# Patient Record
Sex: Male | Born: 1955 | Race: White | Hispanic: No | Smoking: Never smoker
Health system: Southern US, Community
[De-identification: ages and names within clinical notes are randomized; demographics above are authoritative.]

## PROBLEM LIST (undated history)

## (undated) DIAGNOSIS — E119 Type 2 diabetes mellitus without complications: Secondary | ICD-10-CM

## (undated) DIAGNOSIS — I10 Essential (primary) hypertension: Secondary | ICD-10-CM

## (undated) DIAGNOSIS — E785 Hyperlipidemia, unspecified: Secondary | ICD-10-CM

## (undated) HISTORY — DX: Type 2 diabetes mellitus without complications: E11.9

## (undated) HISTORY — PX: SHOULDER SURGERY: SHX246

## (undated) HISTORY — DX: Essential (primary) hypertension: I10

## (undated) HISTORY — PX: KNEE SURGERY: SHX244

## (undated) HISTORY — DX: Hyperlipidemia, unspecified: E78.5

## (undated) HISTORY — PX: CHOLECYSTECTOMY: SHX55

---

## 2013-01-21 ENCOUNTER — Encounter (INDEPENDENT_AMBULATORY_CARE_PROVIDER_SITE_OTHER): Payer: Self-pay | Admitting: *Deleted

## 2015-09-01 DIAGNOSIS — R319 Hematuria, unspecified: Secondary | ICD-10-CM | POA: Diagnosis not present

## 2015-09-01 DIAGNOSIS — R109 Unspecified abdominal pain: Secondary | ICD-10-CM | POA: Diagnosis not present

## 2015-09-01 DIAGNOSIS — N200 Calculus of kidney: Secondary | ICD-10-CM | POA: Diagnosis not present

## 2015-09-02 DIAGNOSIS — R361 Hematospermia: Secondary | ICD-10-CM | POA: Diagnosis not present

## 2015-09-02 DIAGNOSIS — R319 Hematuria, unspecified: Secondary | ICD-10-CM | POA: Diagnosis not present

## 2015-09-07 DIAGNOSIS — R590 Localized enlarged lymph nodes: Secondary | ICD-10-CM | POA: Diagnosis not present

## 2015-09-07 DIAGNOSIS — R361 Hematospermia: Secondary | ICD-10-CM | POA: Diagnosis not present

## 2015-09-07 DIAGNOSIS — R319 Hematuria, unspecified: Secondary | ICD-10-CM | POA: Diagnosis not present

## 2015-09-07 DIAGNOSIS — K760 Fatty (change of) liver, not elsewhere classified: Secondary | ICD-10-CM | POA: Diagnosis not present

## 2015-09-07 DIAGNOSIS — M5136 Other intervertebral disc degeneration, lumbar region: Secondary | ICD-10-CM | POA: Diagnosis not present

## 2015-09-07 DIAGNOSIS — M47816 Spondylosis without myelopathy or radiculopathy, lumbar region: Secondary | ICD-10-CM | POA: Diagnosis not present

## 2015-09-07 DIAGNOSIS — N289 Disorder of kidney and ureter, unspecified: Secondary | ICD-10-CM | POA: Diagnosis not present

## 2015-09-29 DIAGNOSIS — R361 Hematospermia: Secondary | ICD-10-CM | POA: Diagnosis not present

## 2015-09-29 DIAGNOSIS — R319 Hematuria, unspecified: Secondary | ICD-10-CM | POA: Diagnosis not present

## 2015-10-07 DIAGNOSIS — N400 Enlarged prostate without lower urinary tract symptoms: Secondary | ICD-10-CM | POA: Diagnosis not present

## 2015-10-07 DIAGNOSIS — Z789 Other specified health status: Secondary | ICD-10-CM | POA: Diagnosis not present

## 2015-10-07 DIAGNOSIS — Z299 Encounter for prophylactic measures, unspecified: Secondary | ICD-10-CM | POA: Diagnosis not present

## 2015-10-07 DIAGNOSIS — E1165 Type 2 diabetes mellitus with hyperglycemia: Secondary | ICD-10-CM | POA: Diagnosis not present

## 2015-12-17 DIAGNOSIS — M159 Polyosteoarthritis, unspecified: Secondary | ICD-10-CM | POA: Diagnosis not present

## 2015-12-17 DIAGNOSIS — E119 Type 2 diabetes mellitus without complications: Secondary | ICD-10-CM | POA: Diagnosis not present

## 2016-01-14 DIAGNOSIS — N400 Enlarged prostate without lower urinary tract symptoms: Secondary | ICD-10-CM | POA: Diagnosis not present

## 2016-01-14 DIAGNOSIS — M545 Low back pain: Secondary | ICD-10-CM | POA: Diagnosis not present

## 2016-01-14 DIAGNOSIS — E1165 Type 2 diabetes mellitus with hyperglycemia: Secondary | ICD-10-CM | POA: Diagnosis not present

## 2016-01-14 DIAGNOSIS — Z6836 Body mass index (BMI) 36.0-36.9, adult: Secondary | ICD-10-CM | POA: Diagnosis not present

## 2016-02-08 DIAGNOSIS — N200 Calculus of kidney: Secondary | ICD-10-CM | POA: Diagnosis not present

## 2016-03-18 DIAGNOSIS — M159 Polyosteoarthritis, unspecified: Secondary | ICD-10-CM | POA: Diagnosis not present

## 2016-03-18 DIAGNOSIS — E119 Type 2 diabetes mellitus without complications: Secondary | ICD-10-CM | POA: Diagnosis not present

## 2016-03-28 DIAGNOSIS — R8299 Other abnormal findings in urine: Secondary | ICD-10-CM | POA: Diagnosis not present

## 2016-03-28 DIAGNOSIS — R3915 Urgency of urination: Secondary | ICD-10-CM | POA: Diagnosis not present

## 2016-03-28 DIAGNOSIS — N200 Calculus of kidney: Secondary | ICD-10-CM | POA: Diagnosis not present

## 2016-04-21 DIAGNOSIS — M545 Low back pain: Secondary | ICD-10-CM | POA: Diagnosis not present

## 2016-04-21 DIAGNOSIS — Z6836 Body mass index (BMI) 36.0-36.9, adult: Secondary | ICD-10-CM | POA: Diagnosis not present

## 2016-04-21 DIAGNOSIS — E1165 Type 2 diabetes mellitus with hyperglycemia: Secondary | ICD-10-CM | POA: Diagnosis not present

## 2016-04-21 DIAGNOSIS — Z713 Dietary counseling and surveillance: Secondary | ICD-10-CM | POA: Diagnosis not present

## 2016-05-03 DIAGNOSIS — M159 Polyosteoarthritis, unspecified: Secondary | ICD-10-CM | POA: Diagnosis not present

## 2016-05-03 DIAGNOSIS — E119 Type 2 diabetes mellitus without complications: Secondary | ICD-10-CM | POA: Diagnosis not present

## 2016-05-04 DIAGNOSIS — Z1389 Encounter for screening for other disorder: Secondary | ICD-10-CM | POA: Diagnosis not present

## 2016-05-04 DIAGNOSIS — Z0000 Encounter for general adult medical examination without abnormal findings: Secondary | ICD-10-CM | POA: Diagnosis not present

## 2016-05-04 DIAGNOSIS — Z7189 Other specified counseling: Secondary | ICD-10-CM | POA: Diagnosis not present

## 2016-05-04 DIAGNOSIS — Z299 Encounter for prophylactic measures, unspecified: Secondary | ICD-10-CM | POA: Diagnosis not present

## 2016-05-04 DIAGNOSIS — Z1211 Encounter for screening for malignant neoplasm of colon: Secondary | ICD-10-CM | POA: Diagnosis not present

## 2016-05-05 DIAGNOSIS — Z125 Encounter for screening for malignant neoplasm of prostate: Secondary | ICD-10-CM | POA: Diagnosis not present

## 2016-05-05 DIAGNOSIS — Z79899 Other long term (current) drug therapy: Secondary | ICD-10-CM | POA: Diagnosis not present

## 2016-05-05 DIAGNOSIS — R5383 Other fatigue: Secondary | ICD-10-CM | POA: Diagnosis not present

## 2016-05-05 DIAGNOSIS — E7800 Pure hypercholesterolemia, unspecified: Secondary | ICD-10-CM | POA: Diagnosis not present

## 2016-08-12 DIAGNOSIS — E119 Type 2 diabetes mellitus without complications: Secondary | ICD-10-CM | POA: Diagnosis not present

## 2016-08-12 DIAGNOSIS — M159 Polyosteoarthritis, unspecified: Secondary | ICD-10-CM | POA: Diagnosis not present

## 2016-08-23 DIAGNOSIS — E119 Type 2 diabetes mellitus without complications: Secondary | ICD-10-CM | POA: Diagnosis not present

## 2016-09-06 DIAGNOSIS — M159 Polyosteoarthritis, unspecified: Secondary | ICD-10-CM | POA: Diagnosis not present

## 2016-09-06 DIAGNOSIS — E119 Type 2 diabetes mellitus without complications: Secondary | ICD-10-CM | POA: Diagnosis not present

## 2016-10-06 DIAGNOSIS — H35033 Hypertensive retinopathy, bilateral: Secondary | ICD-10-CM | POA: Diagnosis not present

## 2016-10-17 DIAGNOSIS — M159 Polyosteoarthritis, unspecified: Secondary | ICD-10-CM | POA: Diagnosis not present

## 2016-10-17 DIAGNOSIS — E119 Type 2 diabetes mellitus without complications: Secondary | ICD-10-CM | POA: Diagnosis not present

## 2016-11-28 DIAGNOSIS — E119 Type 2 diabetes mellitus without complications: Secondary | ICD-10-CM | POA: Diagnosis not present

## 2016-11-28 DIAGNOSIS — M159 Polyosteoarthritis, unspecified: Secondary | ICD-10-CM | POA: Diagnosis not present

## 2016-11-29 DIAGNOSIS — E1165 Type 2 diabetes mellitus with hyperglycemia: Secondary | ICD-10-CM | POA: Diagnosis not present

## 2016-11-29 DIAGNOSIS — Z299 Encounter for prophylactic measures, unspecified: Secondary | ICD-10-CM | POA: Diagnosis not present

## 2016-11-29 DIAGNOSIS — Z6835 Body mass index (BMI) 35.0-35.9, adult: Secondary | ICD-10-CM | POA: Diagnosis not present

## 2016-11-29 DIAGNOSIS — N400 Enlarged prostate without lower urinary tract symptoms: Secondary | ICD-10-CM | POA: Diagnosis not present

## 2016-11-29 DIAGNOSIS — M1710 Unilateral primary osteoarthritis, unspecified knee: Secondary | ICD-10-CM | POA: Diagnosis not present

## 2016-11-29 DIAGNOSIS — E7800 Pure hypercholesterolemia, unspecified: Secondary | ICD-10-CM | POA: Diagnosis not present

## 2016-12-21 DIAGNOSIS — E119 Type 2 diabetes mellitus without complications: Secondary | ICD-10-CM | POA: Diagnosis not present

## 2016-12-21 DIAGNOSIS — M159 Polyosteoarthritis, unspecified: Secondary | ICD-10-CM | POA: Diagnosis not present

## 2017-03-03 DIAGNOSIS — E119 Type 2 diabetes mellitus without complications: Secondary | ICD-10-CM | POA: Diagnosis not present

## 2017-03-03 DIAGNOSIS — M159 Polyosteoarthritis, unspecified: Secondary | ICD-10-CM | POA: Diagnosis not present

## 2017-03-06 DIAGNOSIS — Z299 Encounter for prophylactic measures, unspecified: Secondary | ICD-10-CM | POA: Diagnosis not present

## 2017-03-06 DIAGNOSIS — Z713 Dietary counseling and surveillance: Secondary | ICD-10-CM | POA: Diagnosis not present

## 2017-03-06 DIAGNOSIS — E1165 Type 2 diabetes mellitus with hyperglycemia: Secondary | ICD-10-CM | POA: Diagnosis not present

## 2017-03-06 DIAGNOSIS — M1710 Unilateral primary osteoarthritis, unspecified knee: Secondary | ICD-10-CM | POA: Diagnosis not present

## 2017-03-06 DIAGNOSIS — N209 Urinary calculus, unspecified: Secondary | ICD-10-CM | POA: Diagnosis not present

## 2017-03-06 DIAGNOSIS — E7800 Pure hypercholesterolemia, unspecified: Secondary | ICD-10-CM | POA: Diagnosis not present

## 2017-03-06 DIAGNOSIS — N400 Enlarged prostate without lower urinary tract symptoms: Secondary | ICD-10-CM | POA: Diagnosis not present

## 2017-03-06 DIAGNOSIS — Z6835 Body mass index (BMI) 35.0-35.9, adult: Secondary | ICD-10-CM | POA: Diagnosis not present

## 2017-05-10 DIAGNOSIS — Z789 Other specified health status: Secondary | ICD-10-CM | POA: Diagnosis not present

## 2017-05-10 DIAGNOSIS — Z299 Encounter for prophylactic measures, unspecified: Secondary | ICD-10-CM | POA: Diagnosis not present

## 2017-05-10 DIAGNOSIS — Z1331 Encounter for screening for depression: Secondary | ICD-10-CM | POA: Diagnosis not present

## 2017-05-10 DIAGNOSIS — Z6835 Body mass index (BMI) 35.0-35.9, adult: Secondary | ICD-10-CM | POA: Diagnosis not present

## 2017-05-10 DIAGNOSIS — Z1339 Encounter for screening examination for other mental health and behavioral disorders: Secondary | ICD-10-CM | POA: Diagnosis not present

## 2017-05-10 DIAGNOSIS — Z0000 Encounter for general adult medical examination without abnormal findings: Secondary | ICD-10-CM | POA: Diagnosis not present

## 2017-05-10 DIAGNOSIS — Z7189 Other specified counseling: Secondary | ICD-10-CM | POA: Diagnosis not present

## 2017-05-10 DIAGNOSIS — Z1211 Encounter for screening for malignant neoplasm of colon: Secondary | ICD-10-CM | POA: Diagnosis not present

## 2017-05-11 DIAGNOSIS — Z79899 Other long term (current) drug therapy: Secondary | ICD-10-CM | POA: Diagnosis not present

## 2017-05-11 DIAGNOSIS — Z125 Encounter for screening for malignant neoplasm of prostate: Secondary | ICD-10-CM | POA: Diagnosis not present

## 2017-05-11 DIAGNOSIS — E7800 Pure hypercholesterolemia, unspecified: Secondary | ICD-10-CM | POA: Diagnosis not present

## 2017-05-11 DIAGNOSIS — R5383 Other fatigue: Secondary | ICD-10-CM | POA: Diagnosis not present

## 2017-05-19 DIAGNOSIS — M159 Polyosteoarthritis, unspecified: Secondary | ICD-10-CM | POA: Diagnosis not present

## 2017-05-19 DIAGNOSIS — E119 Type 2 diabetes mellitus without complications: Secondary | ICD-10-CM | POA: Diagnosis not present

## 2017-06-26 DIAGNOSIS — E1165 Type 2 diabetes mellitus with hyperglycemia: Secondary | ICD-10-CM | POA: Diagnosis not present

## 2017-06-26 DIAGNOSIS — N400 Enlarged prostate without lower urinary tract symptoms: Secondary | ICD-10-CM | POA: Diagnosis not present

## 2017-06-26 DIAGNOSIS — Z6835 Body mass index (BMI) 35.0-35.9, adult: Secondary | ICD-10-CM | POA: Diagnosis not present

## 2017-06-26 DIAGNOSIS — Z299 Encounter for prophylactic measures, unspecified: Secondary | ICD-10-CM | POA: Diagnosis not present

## 2017-06-26 DIAGNOSIS — Z713 Dietary counseling and surveillance: Secondary | ICD-10-CM | POA: Diagnosis not present

## 2017-07-03 DIAGNOSIS — M159 Polyosteoarthritis, unspecified: Secondary | ICD-10-CM | POA: Diagnosis not present

## 2017-07-03 DIAGNOSIS — E119 Type 2 diabetes mellitus without complications: Secondary | ICD-10-CM | POA: Diagnosis not present

## 2017-10-17 DIAGNOSIS — K5900 Constipation, unspecified: Secondary | ICD-10-CM | POA: Diagnosis not present

## 2017-10-17 DIAGNOSIS — Z6836 Body mass index (BMI) 36.0-36.9, adult: Secondary | ICD-10-CM | POA: Diagnosis not present

## 2017-10-17 DIAGNOSIS — Z299 Encounter for prophylactic measures, unspecified: Secondary | ICD-10-CM | POA: Diagnosis not present

## 2017-10-17 DIAGNOSIS — E1165 Type 2 diabetes mellitus with hyperglycemia: Secondary | ICD-10-CM | POA: Diagnosis not present

## 2017-10-17 DIAGNOSIS — E7800 Pure hypercholesterolemia, unspecified: Secondary | ICD-10-CM | POA: Diagnosis not present

## 2017-10-24 DIAGNOSIS — E119 Type 2 diabetes mellitus without complications: Secondary | ICD-10-CM | POA: Diagnosis not present

## 2017-10-24 DIAGNOSIS — M159 Polyosteoarthritis, unspecified: Secondary | ICD-10-CM | POA: Diagnosis not present

## 2017-12-28 DIAGNOSIS — E119 Type 2 diabetes mellitus without complications: Secondary | ICD-10-CM | POA: Diagnosis not present

## 2017-12-28 DIAGNOSIS — M159 Polyosteoarthritis, unspecified: Secondary | ICD-10-CM | POA: Diagnosis not present

## 2018-01-22 DIAGNOSIS — N400 Enlarged prostate without lower urinary tract symptoms: Secondary | ICD-10-CM | POA: Diagnosis not present

## 2018-01-22 DIAGNOSIS — E1165 Type 2 diabetes mellitus with hyperglycemia: Secondary | ICD-10-CM | POA: Diagnosis not present

## 2018-01-22 DIAGNOSIS — Z6836 Body mass index (BMI) 36.0-36.9, adult: Secondary | ICD-10-CM | POA: Diagnosis not present

## 2018-01-22 DIAGNOSIS — Z299 Encounter for prophylactic measures, unspecified: Secondary | ICD-10-CM | POA: Diagnosis not present

## 2018-01-22 DIAGNOSIS — Z713 Dietary counseling and surveillance: Secondary | ICD-10-CM | POA: Diagnosis not present

## 2018-01-26 DIAGNOSIS — E119 Type 2 diabetes mellitus without complications: Secondary | ICD-10-CM | POA: Diagnosis not present

## 2018-01-26 DIAGNOSIS — M159 Polyosteoarthritis, unspecified: Secondary | ICD-10-CM | POA: Diagnosis not present

## 2018-02-28 DIAGNOSIS — M159 Polyosteoarthritis, unspecified: Secondary | ICD-10-CM | POA: Diagnosis not present

## 2018-02-28 DIAGNOSIS — E119 Type 2 diabetes mellitus without complications: Secondary | ICD-10-CM | POA: Diagnosis not present

## 2018-04-13 DIAGNOSIS — E119 Type 2 diabetes mellitus without complications: Secondary | ICD-10-CM | POA: Diagnosis not present

## 2018-04-13 DIAGNOSIS — M159 Polyosteoarthritis, unspecified: Secondary | ICD-10-CM | POA: Diagnosis not present

## 2018-04-30 DIAGNOSIS — E1165 Type 2 diabetes mellitus with hyperglycemia: Secondary | ICD-10-CM | POA: Diagnosis not present

## 2018-04-30 DIAGNOSIS — Z6835 Body mass index (BMI) 35.0-35.9, adult: Secondary | ICD-10-CM | POA: Diagnosis not present

## 2018-04-30 DIAGNOSIS — Z299 Encounter for prophylactic measures, unspecified: Secondary | ICD-10-CM | POA: Diagnosis not present

## 2018-04-30 DIAGNOSIS — E7800 Pure hypercholesterolemia, unspecified: Secondary | ICD-10-CM | POA: Diagnosis not present

## 2018-04-30 DIAGNOSIS — I10 Essential (primary) hypertension: Secondary | ICD-10-CM | POA: Diagnosis not present

## 2018-04-30 DIAGNOSIS — Z87891 Personal history of nicotine dependence: Secondary | ICD-10-CM | POA: Diagnosis not present

## 2018-04-30 DIAGNOSIS — N400 Enlarged prostate without lower urinary tract symptoms: Secondary | ICD-10-CM | POA: Diagnosis not present

## 2018-05-15 DIAGNOSIS — Z299 Encounter for prophylactic measures, unspecified: Secondary | ICD-10-CM | POA: Diagnosis not present

## 2018-05-15 DIAGNOSIS — Z1339 Encounter for screening examination for other mental health and behavioral disorders: Secondary | ICD-10-CM | POA: Diagnosis not present

## 2018-05-15 DIAGNOSIS — Z1331 Encounter for screening for depression: Secondary | ICD-10-CM | POA: Diagnosis not present

## 2018-05-15 DIAGNOSIS — Z0000 Encounter for general adult medical examination without abnormal findings: Secondary | ICD-10-CM | POA: Diagnosis not present

## 2018-05-15 DIAGNOSIS — E7800 Pure hypercholesterolemia, unspecified: Secondary | ICD-10-CM | POA: Diagnosis not present

## 2018-05-15 DIAGNOSIS — Z1211 Encounter for screening for malignant neoplasm of colon: Secondary | ICD-10-CM | POA: Diagnosis not present

## 2018-05-15 DIAGNOSIS — I10 Essential (primary) hypertension: Secondary | ICD-10-CM | POA: Diagnosis not present

## 2018-05-15 DIAGNOSIS — Z6835 Body mass index (BMI) 35.0-35.9, adult: Secondary | ICD-10-CM | POA: Diagnosis not present

## 2018-05-15 DIAGNOSIS — R5383 Other fatigue: Secondary | ICD-10-CM | POA: Diagnosis not present

## 2018-05-15 DIAGNOSIS — Z7189 Other specified counseling: Secondary | ICD-10-CM | POA: Diagnosis not present

## 2018-05-17 DIAGNOSIS — Z79899 Other long term (current) drug therapy: Secondary | ICD-10-CM | POA: Diagnosis not present

## 2018-05-17 DIAGNOSIS — R5383 Other fatigue: Secondary | ICD-10-CM | POA: Diagnosis not present

## 2018-05-17 DIAGNOSIS — Z125 Encounter for screening for malignant neoplasm of prostate: Secondary | ICD-10-CM | POA: Diagnosis not present

## 2018-05-17 DIAGNOSIS — E7800 Pure hypercholesterolemia, unspecified: Secondary | ICD-10-CM | POA: Diagnosis not present

## 2018-06-25 DIAGNOSIS — R195 Other fecal abnormalities: Secondary | ICD-10-CM | POA: Diagnosis not present

## 2018-06-27 DIAGNOSIS — M159 Polyosteoarthritis, unspecified: Secondary | ICD-10-CM | POA: Diagnosis not present

## 2018-06-27 DIAGNOSIS — E119 Type 2 diabetes mellitus without complications: Secondary | ICD-10-CM | POA: Diagnosis not present

## 2018-07-02 DIAGNOSIS — R945 Abnormal results of liver function studies: Secondary | ICD-10-CM | POA: Diagnosis not present

## 2018-07-20 DIAGNOSIS — M159 Polyosteoarthritis, unspecified: Secondary | ICD-10-CM | POA: Diagnosis not present

## 2018-07-20 DIAGNOSIS — E119 Type 2 diabetes mellitus without complications: Secondary | ICD-10-CM | POA: Diagnosis not present

## 2018-08-23 DIAGNOSIS — R945 Abnormal results of liver function studies: Secondary | ICD-10-CM | POA: Diagnosis not present

## 2018-08-30 DIAGNOSIS — E1165 Type 2 diabetes mellitus with hyperglycemia: Secondary | ICD-10-CM | POA: Diagnosis not present

## 2018-08-30 DIAGNOSIS — Z299 Encounter for prophylactic measures, unspecified: Secondary | ICD-10-CM | POA: Diagnosis not present

## 2018-08-30 DIAGNOSIS — R748 Abnormal levels of other serum enzymes: Secondary | ICD-10-CM | POA: Diagnosis not present

## 2018-08-30 DIAGNOSIS — Z6836 Body mass index (BMI) 36.0-36.9, adult: Secondary | ICD-10-CM | POA: Diagnosis not present

## 2018-08-30 DIAGNOSIS — I10 Essential (primary) hypertension: Secondary | ICD-10-CM | POA: Diagnosis not present

## 2018-09-03 DIAGNOSIS — M159 Polyosteoarthritis, unspecified: Secondary | ICD-10-CM | POA: Diagnosis not present

## 2018-09-03 DIAGNOSIS — E119 Type 2 diabetes mellitus without complications: Secondary | ICD-10-CM | POA: Diagnosis not present

## 2018-09-17 DIAGNOSIS — K760 Fatty (change of) liver, not elsewhere classified: Secondary | ICD-10-CM | POA: Diagnosis not present

## 2018-09-17 DIAGNOSIS — R945 Abnormal results of liver function studies: Secondary | ICD-10-CM | POA: Diagnosis not present

## 2018-10-04 DIAGNOSIS — E119 Type 2 diabetes mellitus without complications: Secondary | ICD-10-CM | POA: Diagnosis not present

## 2018-10-04 DIAGNOSIS — M159 Polyosteoarthritis, unspecified: Secondary | ICD-10-CM | POA: Diagnosis not present

## 2018-11-29 DIAGNOSIS — M159 Polyosteoarthritis, unspecified: Secondary | ICD-10-CM | POA: Diagnosis not present

## 2018-11-29 DIAGNOSIS — E119 Type 2 diabetes mellitus without complications: Secondary | ICD-10-CM | POA: Diagnosis not present

## 2018-12-05 DIAGNOSIS — E1165 Type 2 diabetes mellitus with hyperglycemia: Secondary | ICD-10-CM | POA: Diagnosis not present

## 2018-12-05 DIAGNOSIS — Z713 Dietary counseling and surveillance: Secondary | ICD-10-CM | POA: Diagnosis not present

## 2018-12-05 DIAGNOSIS — Z299 Encounter for prophylactic measures, unspecified: Secondary | ICD-10-CM | POA: Diagnosis not present

## 2018-12-05 DIAGNOSIS — I10 Essential (primary) hypertension: Secondary | ICD-10-CM | POA: Diagnosis not present

## 2018-12-05 DIAGNOSIS — Z6835 Body mass index (BMI) 35.0-35.9, adult: Secondary | ICD-10-CM | POA: Diagnosis not present

## 2018-12-26 DIAGNOSIS — E119 Type 2 diabetes mellitus without complications: Secondary | ICD-10-CM | POA: Diagnosis not present

## 2018-12-26 DIAGNOSIS — M159 Polyosteoarthritis, unspecified: Secondary | ICD-10-CM | POA: Diagnosis not present

## 2019-01-22 DIAGNOSIS — M159 Polyosteoarthritis, unspecified: Secondary | ICD-10-CM | POA: Diagnosis not present

## 2019-01-22 DIAGNOSIS — E119 Type 2 diabetes mellitus without complications: Secondary | ICD-10-CM | POA: Diagnosis not present

## 2019-02-20 DIAGNOSIS — M159 Polyosteoarthritis, unspecified: Secondary | ICD-10-CM | POA: Diagnosis not present

## 2019-02-20 DIAGNOSIS — E119 Type 2 diabetes mellitus without complications: Secondary | ICD-10-CM | POA: Diagnosis not present

## 2019-03-13 DIAGNOSIS — Z299 Encounter for prophylactic measures, unspecified: Secondary | ICD-10-CM | POA: Diagnosis not present

## 2019-03-13 DIAGNOSIS — I10 Essential (primary) hypertension: Secondary | ICD-10-CM | POA: Diagnosis not present

## 2019-03-13 DIAGNOSIS — Z6835 Body mass index (BMI) 35.0-35.9, adult: Secondary | ICD-10-CM | POA: Diagnosis not present

## 2019-03-13 DIAGNOSIS — Z713 Dietary counseling and surveillance: Secondary | ICD-10-CM | POA: Diagnosis not present

## 2019-03-13 DIAGNOSIS — E1165 Type 2 diabetes mellitus with hyperglycemia: Secondary | ICD-10-CM | POA: Diagnosis not present

## 2019-03-21 DIAGNOSIS — E119 Type 2 diabetes mellitus without complications: Secondary | ICD-10-CM | POA: Diagnosis not present

## 2019-03-21 DIAGNOSIS — M159 Polyosteoarthritis, unspecified: Secondary | ICD-10-CM | POA: Diagnosis not present

## 2019-04-22 DIAGNOSIS — M159 Polyosteoarthritis, unspecified: Secondary | ICD-10-CM | POA: Diagnosis not present

## 2019-04-22 DIAGNOSIS — E119 Type 2 diabetes mellitus without complications: Secondary | ICD-10-CM | POA: Diagnosis not present

## 2019-05-21 DIAGNOSIS — Z299 Encounter for prophylactic measures, unspecified: Secondary | ICD-10-CM | POA: Diagnosis not present

## 2019-05-21 DIAGNOSIS — Z1211 Encounter for screening for malignant neoplasm of colon: Secondary | ICD-10-CM | POA: Diagnosis not present

## 2019-05-21 DIAGNOSIS — E7800 Pure hypercholesterolemia, unspecified: Secondary | ICD-10-CM | POA: Diagnosis not present

## 2019-05-21 DIAGNOSIS — Z1339 Encounter for screening examination for other mental health and behavioral disorders: Secondary | ICD-10-CM | POA: Diagnosis not present

## 2019-05-21 DIAGNOSIS — Z6835 Body mass index (BMI) 35.0-35.9, adult: Secondary | ICD-10-CM | POA: Diagnosis not present

## 2019-05-21 DIAGNOSIS — Z1331 Encounter for screening for depression: Secondary | ICD-10-CM | POA: Diagnosis not present

## 2019-05-21 DIAGNOSIS — Z7189 Other specified counseling: Secondary | ICD-10-CM | POA: Diagnosis not present

## 2019-05-21 DIAGNOSIS — Z0000 Encounter for general adult medical examination without abnormal findings: Secondary | ICD-10-CM | POA: Diagnosis not present

## 2019-05-21 DIAGNOSIS — R5383 Other fatigue: Secondary | ICD-10-CM | POA: Diagnosis not present

## 2019-05-21 DIAGNOSIS — I10 Essential (primary) hypertension: Secondary | ICD-10-CM | POA: Diagnosis not present

## 2019-05-21 DIAGNOSIS — E1165 Type 2 diabetes mellitus with hyperglycemia: Secondary | ICD-10-CM | POA: Diagnosis not present

## 2019-05-22 DIAGNOSIS — Z79899 Other long term (current) drug therapy: Secondary | ICD-10-CM | POA: Diagnosis not present

## 2019-05-22 DIAGNOSIS — R5383 Other fatigue: Secondary | ICD-10-CM | POA: Diagnosis not present

## 2019-05-22 DIAGNOSIS — Z125 Encounter for screening for malignant neoplasm of prostate: Secondary | ICD-10-CM | POA: Diagnosis not present

## 2019-05-22 DIAGNOSIS — E7800 Pure hypercholesterolemia, unspecified: Secondary | ICD-10-CM | POA: Diagnosis not present

## 2019-05-31 DIAGNOSIS — E119 Type 2 diabetes mellitus without complications: Secondary | ICD-10-CM | POA: Diagnosis not present

## 2019-05-31 DIAGNOSIS — M159 Polyosteoarthritis, unspecified: Secondary | ICD-10-CM | POA: Diagnosis not present

## 2019-06-18 DIAGNOSIS — E1165 Type 2 diabetes mellitus with hyperglycemia: Secondary | ICD-10-CM | POA: Diagnosis not present

## 2019-06-18 DIAGNOSIS — Z713 Dietary counseling and surveillance: Secondary | ICD-10-CM | POA: Diagnosis not present

## 2019-06-18 DIAGNOSIS — Z299 Encounter for prophylactic measures, unspecified: Secondary | ICD-10-CM | POA: Diagnosis not present

## 2019-06-18 DIAGNOSIS — I10 Essential (primary) hypertension: Secondary | ICD-10-CM | POA: Diagnosis not present

## 2019-06-18 DIAGNOSIS — Z6835 Body mass index (BMI) 35.0-35.9, adult: Secondary | ICD-10-CM | POA: Diagnosis not present

## 2019-07-16 DIAGNOSIS — E1165 Type 2 diabetes mellitus with hyperglycemia: Secondary | ICD-10-CM | POA: Diagnosis not present

## 2019-07-31 DIAGNOSIS — M159 Polyosteoarthritis, unspecified: Secondary | ICD-10-CM | POA: Diagnosis not present

## 2019-07-31 DIAGNOSIS — E119 Type 2 diabetes mellitus without complications: Secondary | ICD-10-CM | POA: Diagnosis not present

## 2019-08-20 DIAGNOSIS — H25813 Combined forms of age-related cataract, bilateral: Secondary | ICD-10-CM | POA: Diagnosis not present

## 2019-08-20 DIAGNOSIS — H0288B Meibomian gland dysfunction left eye, upper and lower eyelids: Secondary | ICD-10-CM | POA: Diagnosis not present

## 2019-08-20 DIAGNOSIS — E113293 Type 2 diabetes mellitus with mild nonproliferative diabetic retinopathy without macular edema, bilateral: Secondary | ICD-10-CM | POA: Diagnosis not present

## 2019-08-20 DIAGNOSIS — H0102B Squamous blepharitis left eye, upper and lower eyelids: Secondary | ICD-10-CM | POA: Diagnosis not present

## 2019-08-20 DIAGNOSIS — H0102A Squamous blepharitis right eye, upper and lower eyelids: Secondary | ICD-10-CM | POA: Diagnosis not present

## 2019-08-20 DIAGNOSIS — H524 Presbyopia: Secondary | ICD-10-CM | POA: Diagnosis not present

## 2019-08-20 DIAGNOSIS — E1136 Type 2 diabetes mellitus with diabetic cataract: Secondary | ICD-10-CM | POA: Diagnosis not present

## 2019-08-20 DIAGNOSIS — Z7984 Long term (current) use of oral hypoglycemic drugs: Secondary | ICD-10-CM | POA: Diagnosis not present

## 2019-08-20 DIAGNOSIS — H43822 Vitreomacular adhesion, left eye: Secondary | ICD-10-CM | POA: Diagnosis not present

## 2019-08-20 DIAGNOSIS — H0288A Meibomian gland dysfunction right eye, upper and lower eyelids: Secondary | ICD-10-CM | POA: Diagnosis not present

## 2019-08-20 DIAGNOSIS — H04123 Dry eye syndrome of bilateral lacrimal glands: Secondary | ICD-10-CM | POA: Diagnosis not present

## 2019-08-20 DIAGNOSIS — H35373 Puckering of macula, bilateral: Secondary | ICD-10-CM | POA: Diagnosis not present

## 2019-08-20 DIAGNOSIS — H5213 Myopia, bilateral: Secondary | ICD-10-CM | POA: Diagnosis not present

## 2019-08-27 DIAGNOSIS — E1165 Type 2 diabetes mellitus with hyperglycemia: Secondary | ICD-10-CM | POA: Diagnosis not present

## 2019-08-28 DIAGNOSIS — M159 Polyosteoarthritis, unspecified: Secondary | ICD-10-CM | POA: Diagnosis not present

## 2019-08-28 DIAGNOSIS — E119 Type 2 diabetes mellitus without complications: Secondary | ICD-10-CM | POA: Diagnosis not present

## 2019-09-06 DIAGNOSIS — H25813 Combined forms of age-related cataract, bilateral: Secondary | ICD-10-CM | POA: Diagnosis not present

## 2019-09-06 DIAGNOSIS — H43822 Vitreomacular adhesion, left eye: Secondary | ICD-10-CM | POA: Diagnosis not present

## 2019-09-06 DIAGNOSIS — H35372 Puckering of macula, left eye: Secondary | ICD-10-CM | POA: Diagnosis not present

## 2019-09-25 DIAGNOSIS — E1165 Type 2 diabetes mellitus with hyperglycemia: Secondary | ICD-10-CM | POA: Diagnosis not present

## 2019-09-25 DIAGNOSIS — Z299 Encounter for prophylactic measures, unspecified: Secondary | ICD-10-CM | POA: Diagnosis not present

## 2019-09-25 DIAGNOSIS — Z6834 Body mass index (BMI) 34.0-34.9, adult: Secondary | ICD-10-CM | POA: Diagnosis not present

## 2019-09-25 DIAGNOSIS — D692 Other nonthrombocytopenic purpura: Secondary | ICD-10-CM | POA: Diagnosis not present

## 2019-09-25 DIAGNOSIS — I10 Essential (primary) hypertension: Secondary | ICD-10-CM | POA: Diagnosis not present

## 2019-09-29 DIAGNOSIS — E1165 Type 2 diabetes mellitus with hyperglycemia: Secondary | ICD-10-CM | POA: Diagnosis not present

## 2019-10-03 DIAGNOSIS — H35372 Puckering of macula, left eye: Secondary | ICD-10-CM | POA: Diagnosis not present

## 2019-10-03 DIAGNOSIS — H43822 Vitreomacular adhesion, left eye: Secondary | ICD-10-CM | POA: Diagnosis not present

## 2019-10-04 DIAGNOSIS — M159 Polyosteoarthritis, unspecified: Secondary | ICD-10-CM | POA: Diagnosis not present

## 2019-10-04 DIAGNOSIS — E119 Type 2 diabetes mellitus without complications: Secondary | ICD-10-CM | POA: Diagnosis not present

## 2019-10-22 DIAGNOSIS — H524 Presbyopia: Secondary | ICD-10-CM | POA: Diagnosis not present

## 2019-10-22 DIAGNOSIS — H2513 Age-related nuclear cataract, bilateral: Secondary | ICD-10-CM | POA: Diagnosis not present

## 2019-10-22 DIAGNOSIS — H43822 Vitreomacular adhesion, left eye: Secondary | ICD-10-CM | POA: Diagnosis not present

## 2019-10-22 DIAGNOSIS — H35372 Puckering of macula, left eye: Secondary | ICD-10-CM | POA: Diagnosis not present

## 2019-10-29 DIAGNOSIS — E1165 Type 2 diabetes mellitus with hyperglycemia: Secondary | ICD-10-CM | POA: Diagnosis not present

## 2019-11-01 DIAGNOSIS — Z20822 Contact with and (suspected) exposure to covid-19: Secondary | ICD-10-CM | POA: Diagnosis not present

## 2019-11-01 DIAGNOSIS — Z20828 Contact with and (suspected) exposure to other viral communicable diseases: Secondary | ICD-10-CM | POA: Diagnosis not present

## 2019-11-07 DIAGNOSIS — H2181 Floppy iris syndrome: Secondary | ICD-10-CM | POA: Diagnosis not present

## 2019-11-07 DIAGNOSIS — Z7984 Long term (current) use of oral hypoglycemic drugs: Secondary | ICD-10-CM | POA: Diagnosis not present

## 2019-11-07 DIAGNOSIS — E669 Obesity, unspecified: Secondary | ICD-10-CM | POA: Diagnosis not present

## 2019-11-07 DIAGNOSIS — H2512 Age-related nuclear cataract, left eye: Secondary | ICD-10-CM | POA: Diagnosis not present

## 2019-11-07 DIAGNOSIS — E119 Type 2 diabetes mellitus without complications: Secondary | ICD-10-CM | POA: Diagnosis not present

## 2019-11-07 DIAGNOSIS — Z6834 Body mass index (BMI) 34.0-34.9, adult: Secondary | ICD-10-CM | POA: Diagnosis not present

## 2019-11-07 DIAGNOSIS — H2513 Age-related nuclear cataract, bilateral: Secondary | ICD-10-CM | POA: Diagnosis not present

## 2019-11-07 DIAGNOSIS — H35372 Puckering of macula, left eye: Secondary | ICD-10-CM | POA: Diagnosis not present

## 2019-11-08 DIAGNOSIS — Z48810 Encounter for surgical aftercare following surgery on the sense organs: Secondary | ICD-10-CM | POA: Diagnosis not present

## 2019-11-08 DIAGNOSIS — Z961 Presence of intraocular lens: Secondary | ICD-10-CM | POA: Diagnosis not present

## 2019-11-08 DIAGNOSIS — H43822 Vitreomacular adhesion, left eye: Secondary | ICD-10-CM | POA: Diagnosis not present

## 2019-11-08 DIAGNOSIS — H2511 Age-related nuclear cataract, right eye: Secondary | ICD-10-CM | POA: Diagnosis not present

## 2019-11-08 DIAGNOSIS — H35372 Puckering of macula, left eye: Secondary | ICD-10-CM | POA: Diagnosis not present

## 2019-11-11 DIAGNOSIS — E119 Type 2 diabetes mellitus without complications: Secondary | ICD-10-CM | POA: Diagnosis not present

## 2019-11-11 DIAGNOSIS — M159 Polyosteoarthritis, unspecified: Secondary | ICD-10-CM | POA: Diagnosis not present

## 2019-11-15 DIAGNOSIS — H35372 Puckering of macula, left eye: Secondary | ICD-10-CM | POA: Diagnosis not present

## 2019-11-15 DIAGNOSIS — H524 Presbyopia: Secondary | ICD-10-CM | POA: Diagnosis not present

## 2019-11-15 DIAGNOSIS — H43822 Vitreomacular adhesion, left eye: Secondary | ICD-10-CM | POA: Diagnosis not present

## 2019-11-15 DIAGNOSIS — Z961 Presence of intraocular lens: Secondary | ICD-10-CM | POA: Diagnosis not present

## 2019-11-15 DIAGNOSIS — Z9842 Cataract extraction status, left eye: Secondary | ICD-10-CM | POA: Diagnosis not present

## 2019-11-15 DIAGNOSIS — H269 Unspecified cataract: Secondary | ICD-10-CM | POA: Diagnosis not present

## 2019-11-15 DIAGNOSIS — H2511 Age-related nuclear cataract, right eye: Secondary | ICD-10-CM | POA: Diagnosis not present

## 2019-11-29 DIAGNOSIS — E1165 Type 2 diabetes mellitus with hyperglycemia: Secondary | ICD-10-CM | POA: Diagnosis not present

## 2019-12-20 DIAGNOSIS — H35372 Puckering of macula, left eye: Secondary | ICD-10-CM | POA: Diagnosis not present

## 2019-12-20 DIAGNOSIS — Z48810 Encounter for surgical aftercare following surgery on the sense organs: Secondary | ICD-10-CM | POA: Diagnosis not present

## 2019-12-20 DIAGNOSIS — H43822 Vitreomacular adhesion, left eye: Secondary | ICD-10-CM | POA: Diagnosis not present

## 2019-12-20 DIAGNOSIS — H524 Presbyopia: Secondary | ICD-10-CM | POA: Diagnosis not present

## 2019-12-20 DIAGNOSIS — Z961 Presence of intraocular lens: Secondary | ICD-10-CM | POA: Diagnosis not present

## 2019-12-20 DIAGNOSIS — H2511 Age-related nuclear cataract, right eye: Secondary | ICD-10-CM | POA: Diagnosis not present

## 2019-12-29 DIAGNOSIS — E1165 Type 2 diabetes mellitus with hyperglycemia: Secondary | ICD-10-CM | POA: Diagnosis not present

## 2019-12-29 DIAGNOSIS — E119 Type 2 diabetes mellitus without complications: Secondary | ICD-10-CM | POA: Diagnosis not present

## 2019-12-29 DIAGNOSIS — M159 Polyosteoarthritis, unspecified: Secondary | ICD-10-CM | POA: Diagnosis not present

## 2020-01-03 DIAGNOSIS — H35372 Puckering of macula, left eye: Secondary | ICD-10-CM | POA: Diagnosis not present

## 2020-01-03 DIAGNOSIS — H43822 Vitreomacular adhesion, left eye: Secondary | ICD-10-CM | POA: Diagnosis not present

## 2020-01-09 DIAGNOSIS — I10 Essential (primary) hypertension: Secondary | ICD-10-CM | POA: Diagnosis not present

## 2020-01-09 DIAGNOSIS — E1165 Type 2 diabetes mellitus with hyperglycemia: Secondary | ICD-10-CM | POA: Diagnosis not present

## 2020-01-09 DIAGNOSIS — Z6834 Body mass index (BMI) 34.0-34.9, adult: Secondary | ICD-10-CM | POA: Diagnosis not present

## 2020-01-09 DIAGNOSIS — Z713 Dietary counseling and surveillance: Secondary | ICD-10-CM | POA: Diagnosis not present

## 2020-01-09 DIAGNOSIS — Z299 Encounter for prophylactic measures, unspecified: Secondary | ICD-10-CM | POA: Diagnosis not present

## 2020-01-29 DIAGNOSIS — E119 Type 2 diabetes mellitus without complications: Secondary | ICD-10-CM | POA: Diagnosis not present

## 2020-01-29 DIAGNOSIS — E1165 Type 2 diabetes mellitus with hyperglycemia: Secondary | ICD-10-CM | POA: Diagnosis not present

## 2020-01-29 DIAGNOSIS — M159 Polyosteoarthritis, unspecified: Secondary | ICD-10-CM | POA: Diagnosis not present

## 2020-02-12 DIAGNOSIS — Z01812 Encounter for preprocedural laboratory examination: Secondary | ICD-10-CM | POA: Diagnosis not present

## 2020-02-12 DIAGNOSIS — H43822 Vitreomacular adhesion, left eye: Secondary | ICD-10-CM | POA: Diagnosis not present

## 2020-02-12 DIAGNOSIS — H35372 Puckering of macula, left eye: Secondary | ICD-10-CM | POA: Diagnosis not present

## 2020-02-12 DIAGNOSIS — Z20822 Contact with and (suspected) exposure to covid-19: Secondary | ICD-10-CM | POA: Diagnosis not present

## 2020-02-17 DIAGNOSIS — H35372 Puckering of macula, left eye: Secondary | ICD-10-CM | POA: Diagnosis not present

## 2020-02-17 DIAGNOSIS — E119 Type 2 diabetes mellitus without complications: Secondary | ICD-10-CM | POA: Diagnosis not present

## 2020-02-17 DIAGNOSIS — H43822 Vitreomacular adhesion, left eye: Secondary | ICD-10-CM | POA: Diagnosis not present

## 2020-02-18 DIAGNOSIS — Z7984 Long term (current) use of oral hypoglycemic drugs: Secondary | ICD-10-CM | POA: Diagnosis not present

## 2020-02-18 DIAGNOSIS — E113293 Type 2 diabetes mellitus with mild nonproliferative diabetic retinopathy without macular edema, bilateral: Secondary | ICD-10-CM | POA: Diagnosis not present

## 2020-02-18 DIAGNOSIS — Z48810 Encounter for surgical aftercare following surgery on the sense organs: Secondary | ICD-10-CM | POA: Diagnosis not present

## 2020-02-18 DIAGNOSIS — H43822 Vitreomacular adhesion, left eye: Secondary | ICD-10-CM | POA: Diagnosis not present

## 2020-02-18 DIAGNOSIS — E1136 Type 2 diabetes mellitus with diabetic cataract: Secondary | ICD-10-CM | POA: Diagnosis not present

## 2020-02-18 DIAGNOSIS — H35372 Puckering of macula, left eye: Secondary | ICD-10-CM | POA: Diagnosis not present

## 2020-02-18 DIAGNOSIS — Z961 Presence of intraocular lens: Secondary | ICD-10-CM | POA: Diagnosis not present

## 2020-02-25 DIAGNOSIS — I10 Essential (primary) hypertension: Secondary | ICD-10-CM | POA: Diagnosis not present

## 2020-02-25 DIAGNOSIS — E7800 Pure hypercholesterolemia, unspecified: Secondary | ICD-10-CM | POA: Diagnosis not present

## 2020-02-25 DIAGNOSIS — E1165 Type 2 diabetes mellitus with hyperglycemia: Secondary | ICD-10-CM | POA: Diagnosis not present

## 2020-02-25 DIAGNOSIS — Z299 Encounter for prophylactic measures, unspecified: Secondary | ICD-10-CM | POA: Diagnosis not present

## 2020-02-25 DIAGNOSIS — Z6835 Body mass index (BMI) 35.0-35.9, adult: Secondary | ICD-10-CM | POA: Diagnosis not present

## 2020-02-26 DIAGNOSIS — H269 Unspecified cataract: Secondary | ICD-10-CM | POA: Diagnosis not present

## 2020-02-26 DIAGNOSIS — H35372 Puckering of macula, left eye: Secondary | ICD-10-CM | POA: Diagnosis not present

## 2020-02-26 DIAGNOSIS — H43822 Vitreomacular adhesion, left eye: Secondary | ICD-10-CM | POA: Diagnosis not present

## 2020-02-26 DIAGNOSIS — E1136 Type 2 diabetes mellitus with diabetic cataract: Secondary | ICD-10-CM | POA: Diagnosis not present

## 2020-02-26 DIAGNOSIS — Z7984 Long term (current) use of oral hypoglycemic drugs: Secondary | ICD-10-CM | POA: Diagnosis not present

## 2020-02-26 DIAGNOSIS — E113293 Type 2 diabetes mellitus with mild nonproliferative diabetic retinopathy without macular edema, bilateral: Secondary | ICD-10-CM | POA: Diagnosis not present

## 2020-02-26 DIAGNOSIS — Z48810 Encounter for surgical aftercare following surgery on the sense organs: Secondary | ICD-10-CM | POA: Diagnosis not present

## 2020-02-26 DIAGNOSIS — Z961 Presence of intraocular lens: Secondary | ICD-10-CM | POA: Diagnosis not present

## 2020-02-28 DIAGNOSIS — E119 Type 2 diabetes mellitus without complications: Secondary | ICD-10-CM | POA: Diagnosis not present

## 2020-02-28 DIAGNOSIS — M159 Polyosteoarthritis, unspecified: Secondary | ICD-10-CM | POA: Diagnosis not present

## 2020-02-28 DIAGNOSIS — E1165 Type 2 diabetes mellitus with hyperglycemia: Secondary | ICD-10-CM | POA: Diagnosis not present

## 2020-03-04 DIAGNOSIS — M159 Polyosteoarthritis, unspecified: Secondary | ICD-10-CM | POA: Diagnosis not present

## 2020-03-04 DIAGNOSIS — E119 Type 2 diabetes mellitus without complications: Secondary | ICD-10-CM | POA: Diagnosis not present

## 2020-03-26 DIAGNOSIS — E1136 Type 2 diabetes mellitus with diabetic cataract: Secondary | ICD-10-CM | POA: Diagnosis not present

## 2020-03-26 DIAGNOSIS — Z961 Presence of intraocular lens: Secondary | ICD-10-CM | POA: Diagnosis not present

## 2020-03-26 DIAGNOSIS — Z48810 Encounter for surgical aftercare following surgery on the sense organs: Secondary | ICD-10-CM | POA: Diagnosis not present

## 2020-03-26 DIAGNOSIS — Z7984 Long term (current) use of oral hypoglycemic drugs: Secondary | ICD-10-CM | POA: Diagnosis not present

## 2020-03-26 DIAGNOSIS — E113293 Type 2 diabetes mellitus with mild nonproliferative diabetic retinopathy without macular edema, bilateral: Secondary | ICD-10-CM | POA: Diagnosis not present

## 2020-03-26 DIAGNOSIS — H35372 Puckering of macula, left eye: Secondary | ICD-10-CM | POA: Diagnosis not present

## 2020-03-26 DIAGNOSIS — H269 Unspecified cataract: Secondary | ICD-10-CM | POA: Diagnosis not present

## 2020-03-26 DIAGNOSIS — H43822 Vitreomacular adhesion, left eye: Secondary | ICD-10-CM | POA: Diagnosis not present

## 2020-03-31 DIAGNOSIS — E1165 Type 2 diabetes mellitus with hyperglycemia: Secondary | ICD-10-CM | POA: Diagnosis not present

## 2020-04-17 DIAGNOSIS — Z299 Encounter for prophylactic measures, unspecified: Secondary | ICD-10-CM | POA: Diagnosis not present

## 2020-04-17 DIAGNOSIS — E1165 Type 2 diabetes mellitus with hyperglycemia: Secondary | ICD-10-CM | POA: Diagnosis not present

## 2020-04-17 DIAGNOSIS — I10 Essential (primary) hypertension: Secondary | ICD-10-CM | POA: Diagnosis not present

## 2020-04-17 DIAGNOSIS — Z23 Encounter for immunization: Secondary | ICD-10-CM | POA: Diagnosis not present

## 2020-04-30 DIAGNOSIS — E1165 Type 2 diabetes mellitus with hyperglycemia: Secondary | ICD-10-CM | POA: Diagnosis not present

## 2020-04-30 DIAGNOSIS — E119 Type 2 diabetes mellitus without complications: Secondary | ICD-10-CM | POA: Diagnosis not present

## 2020-04-30 DIAGNOSIS — M159 Polyosteoarthritis, unspecified: Secondary | ICD-10-CM | POA: Diagnosis not present

## 2020-05-15 DIAGNOSIS — Z23 Encounter for immunization: Secondary | ICD-10-CM | POA: Diagnosis not present

## 2020-05-26 DIAGNOSIS — I10 Essential (primary) hypertension: Secondary | ICD-10-CM | POA: Diagnosis not present

## 2020-05-26 DIAGNOSIS — Z0000 Encounter for general adult medical examination without abnormal findings: Secondary | ICD-10-CM | POA: Diagnosis not present

## 2020-05-26 DIAGNOSIS — Z1339 Encounter for screening examination for other mental health and behavioral disorders: Secondary | ICD-10-CM | POA: Diagnosis not present

## 2020-05-26 DIAGNOSIS — E669 Obesity, unspecified: Secondary | ICD-10-CM | POA: Diagnosis not present

## 2020-05-26 DIAGNOSIS — Z299 Encounter for prophylactic measures, unspecified: Secondary | ICD-10-CM | POA: Diagnosis not present

## 2020-05-26 DIAGNOSIS — Z1331 Encounter for screening for depression: Secondary | ICD-10-CM | POA: Diagnosis not present

## 2020-05-26 DIAGNOSIS — Z7189 Other specified counseling: Secondary | ICD-10-CM | POA: Diagnosis not present

## 2020-05-26 DIAGNOSIS — Z6835 Body mass index (BMI) 35.0-35.9, adult: Secondary | ICD-10-CM | POA: Diagnosis not present

## 2020-05-27 DIAGNOSIS — R5383 Other fatigue: Secondary | ICD-10-CM | POA: Diagnosis not present

## 2020-05-27 DIAGNOSIS — E7800 Pure hypercholesterolemia, unspecified: Secondary | ICD-10-CM | POA: Diagnosis not present

## 2020-05-27 DIAGNOSIS — Z125 Encounter for screening for malignant neoplasm of prostate: Secondary | ICD-10-CM | POA: Diagnosis not present

## 2020-05-27 DIAGNOSIS — Z79899 Other long term (current) drug therapy: Secondary | ICD-10-CM | POA: Diagnosis not present

## 2020-05-29 DIAGNOSIS — E119 Type 2 diabetes mellitus without complications: Secondary | ICD-10-CM | POA: Diagnosis not present

## 2020-05-29 DIAGNOSIS — M159 Polyosteoarthritis, unspecified: Secondary | ICD-10-CM | POA: Diagnosis not present

## 2020-05-30 DIAGNOSIS — E1165 Type 2 diabetes mellitus with hyperglycemia: Secondary | ICD-10-CM | POA: Diagnosis not present

## 2020-06-30 DIAGNOSIS — Z961 Presence of intraocular lens: Secondary | ICD-10-CM | POA: Diagnosis not present

## 2020-06-30 DIAGNOSIS — E113212 Type 2 diabetes mellitus with mild nonproliferative diabetic retinopathy with macular edema, left eye: Secondary | ICD-10-CM | POA: Diagnosis not present

## 2020-06-30 DIAGNOSIS — H35372 Puckering of macula, left eye: Secondary | ICD-10-CM | POA: Diagnosis not present

## 2020-06-30 DIAGNOSIS — E113291 Type 2 diabetes mellitus with mild nonproliferative diabetic retinopathy without macular edema, right eye: Secondary | ICD-10-CM | POA: Diagnosis not present

## 2020-06-30 DIAGNOSIS — M159 Polyosteoarthritis, unspecified: Secondary | ICD-10-CM | POA: Diagnosis not present

## 2020-06-30 DIAGNOSIS — E1165 Type 2 diabetes mellitus with hyperglycemia: Secondary | ICD-10-CM | POA: Diagnosis not present

## 2020-06-30 DIAGNOSIS — H43822 Vitreomacular adhesion, left eye: Secondary | ICD-10-CM | POA: Diagnosis not present

## 2020-06-30 DIAGNOSIS — H2511 Age-related nuclear cataract, right eye: Secondary | ICD-10-CM | POA: Diagnosis not present

## 2020-06-30 DIAGNOSIS — E119 Type 2 diabetes mellitus without complications: Secondary | ICD-10-CM | POA: Diagnosis not present

## 2020-07-28 DIAGNOSIS — E113291 Type 2 diabetes mellitus with mild nonproliferative diabetic retinopathy without macular edema, right eye: Secondary | ICD-10-CM | POA: Diagnosis not present

## 2020-07-28 DIAGNOSIS — E113212 Type 2 diabetes mellitus with mild nonproliferative diabetic retinopathy with macular edema, left eye: Secondary | ICD-10-CM | POA: Diagnosis not present

## 2020-07-28 DIAGNOSIS — Z961 Presence of intraocular lens: Secondary | ICD-10-CM | POA: Diagnosis not present

## 2020-07-28 DIAGNOSIS — H35372 Puckering of macula, left eye: Secondary | ICD-10-CM | POA: Diagnosis not present

## 2020-07-28 DIAGNOSIS — H2511 Age-related nuclear cataract, right eye: Secondary | ICD-10-CM | POA: Diagnosis not present

## 2020-07-28 DIAGNOSIS — Z9842 Cataract extraction status, left eye: Secondary | ICD-10-CM | POA: Diagnosis not present

## 2020-07-28 DIAGNOSIS — H43822 Vitreomacular adhesion, left eye: Secondary | ICD-10-CM | POA: Diagnosis not present

## 2020-07-29 DIAGNOSIS — I10 Essential (primary) hypertension: Secondary | ICD-10-CM | POA: Diagnosis not present

## 2020-07-29 DIAGNOSIS — Z713 Dietary counseling and surveillance: Secondary | ICD-10-CM | POA: Diagnosis not present

## 2020-07-29 DIAGNOSIS — Z6835 Body mass index (BMI) 35.0-35.9, adult: Secondary | ICD-10-CM | POA: Diagnosis not present

## 2020-07-29 DIAGNOSIS — E1165 Type 2 diabetes mellitus with hyperglycemia: Secondary | ICD-10-CM | POA: Diagnosis not present

## 2020-07-29 DIAGNOSIS — Z299 Encounter for prophylactic measures, unspecified: Secondary | ICD-10-CM | POA: Diagnosis not present

## 2020-07-30 DIAGNOSIS — E1165 Type 2 diabetes mellitus with hyperglycemia: Secondary | ICD-10-CM | POA: Diagnosis not present

## 2020-08-25 DIAGNOSIS — E113212 Type 2 diabetes mellitus with mild nonproliferative diabetic retinopathy with macular edema, left eye: Secondary | ICD-10-CM | POA: Diagnosis not present

## 2020-08-25 DIAGNOSIS — Z961 Presence of intraocular lens: Secondary | ICD-10-CM | POA: Diagnosis not present

## 2020-08-25 DIAGNOSIS — E113291 Type 2 diabetes mellitus with mild nonproliferative diabetic retinopathy without macular edema, right eye: Secondary | ICD-10-CM | POA: Diagnosis not present

## 2020-08-25 DIAGNOSIS — H2511 Age-related nuclear cataract, right eye: Secondary | ICD-10-CM | POA: Diagnosis not present

## 2020-08-25 DIAGNOSIS — Z9842 Cataract extraction status, left eye: Secondary | ICD-10-CM | POA: Diagnosis not present

## 2020-08-31 DIAGNOSIS — E1165 Type 2 diabetes mellitus with hyperglycemia: Secondary | ICD-10-CM | POA: Diagnosis not present

## 2020-09-28 DIAGNOSIS — E1165 Type 2 diabetes mellitus with hyperglycemia: Secondary | ICD-10-CM | POA: Diagnosis not present

## 2020-09-29 DIAGNOSIS — E113212 Type 2 diabetes mellitus with mild nonproliferative diabetic retinopathy with macular edema, left eye: Secondary | ICD-10-CM | POA: Diagnosis not present

## 2020-10-29 DIAGNOSIS — E1165 Type 2 diabetes mellitus with hyperglycemia: Secondary | ICD-10-CM | POA: Diagnosis not present

## 2020-11-11 DIAGNOSIS — I10 Essential (primary) hypertension: Secondary | ICD-10-CM | POA: Diagnosis not present

## 2020-11-11 DIAGNOSIS — Z299 Encounter for prophylactic measures, unspecified: Secondary | ICD-10-CM | POA: Diagnosis not present

## 2020-11-11 DIAGNOSIS — E7800 Pure hypercholesterolemia, unspecified: Secondary | ICD-10-CM | POA: Diagnosis not present

## 2020-11-11 DIAGNOSIS — E1165 Type 2 diabetes mellitus with hyperglycemia: Secondary | ICD-10-CM | POA: Diagnosis not present

## 2020-11-24 DIAGNOSIS — H43822 Vitreomacular adhesion, left eye: Secondary | ICD-10-CM | POA: Diagnosis not present

## 2020-11-24 DIAGNOSIS — H2511 Age-related nuclear cataract, right eye: Secondary | ICD-10-CM | POA: Diagnosis not present

## 2020-11-24 DIAGNOSIS — E113212 Type 2 diabetes mellitus with mild nonproliferative diabetic retinopathy with macular edema, left eye: Secondary | ICD-10-CM | POA: Diagnosis not present

## 2020-11-24 DIAGNOSIS — H25813 Combined forms of age-related cataract, bilateral: Secondary | ICD-10-CM | POA: Diagnosis not present

## 2020-11-24 DIAGNOSIS — Z9842 Cataract extraction status, left eye: Secondary | ICD-10-CM | POA: Diagnosis not present

## 2020-11-24 DIAGNOSIS — H35372 Puckering of macula, left eye: Secondary | ICD-10-CM | POA: Diagnosis not present

## 2020-11-27 DIAGNOSIS — E1165 Type 2 diabetes mellitus with hyperglycemia: Secondary | ICD-10-CM | POA: Diagnosis not present

## 2020-12-29 DIAGNOSIS — E1165 Type 2 diabetes mellitus with hyperglycemia: Secondary | ICD-10-CM | POA: Diagnosis not present

## 2021-01-28 DIAGNOSIS — E1165 Type 2 diabetes mellitus with hyperglycemia: Secondary | ICD-10-CM | POA: Diagnosis not present

## 2021-02-23 DIAGNOSIS — H35372 Puckering of macula, left eye: Secondary | ICD-10-CM | POA: Diagnosis not present

## 2021-02-23 DIAGNOSIS — Z9842 Cataract extraction status, left eye: Secondary | ICD-10-CM | POA: Diagnosis not present

## 2021-02-23 DIAGNOSIS — H43822 Vitreomacular adhesion, left eye: Secondary | ICD-10-CM | POA: Diagnosis not present

## 2021-02-23 DIAGNOSIS — H25813 Combined forms of age-related cataract, bilateral: Secondary | ICD-10-CM | POA: Diagnosis not present

## 2021-02-23 DIAGNOSIS — E113212 Type 2 diabetes mellitus with mild nonproliferative diabetic retinopathy with macular edema, left eye: Secondary | ICD-10-CM | POA: Diagnosis not present

## 2021-02-23 DIAGNOSIS — H2511 Age-related nuclear cataract, right eye: Secondary | ICD-10-CM | POA: Diagnosis not present

## 2021-02-24 DIAGNOSIS — D692 Other nonthrombocytopenic purpura: Secondary | ICD-10-CM | POA: Diagnosis not present

## 2021-02-24 DIAGNOSIS — Z299 Encounter for prophylactic measures, unspecified: Secondary | ICD-10-CM | POA: Diagnosis not present

## 2021-02-24 DIAGNOSIS — I10 Essential (primary) hypertension: Secondary | ICD-10-CM | POA: Diagnosis not present

## 2021-02-24 DIAGNOSIS — E1165 Type 2 diabetes mellitus with hyperglycemia: Secondary | ICD-10-CM | POA: Diagnosis not present

## 2021-02-26 DIAGNOSIS — E1165 Type 2 diabetes mellitus with hyperglycemia: Secondary | ICD-10-CM | POA: Diagnosis not present

## 2021-03-31 DIAGNOSIS — E1165 Type 2 diabetes mellitus with hyperglycemia: Secondary | ICD-10-CM | POA: Diagnosis not present

## 2021-04-30 DIAGNOSIS — E1165 Type 2 diabetes mellitus with hyperglycemia: Secondary | ICD-10-CM | POA: Diagnosis not present

## 2021-05-31 DIAGNOSIS — E1165 Type 2 diabetes mellitus with hyperglycemia: Secondary | ICD-10-CM | POA: Diagnosis not present

## 2021-06-02 DIAGNOSIS — E1165 Type 2 diabetes mellitus with hyperglycemia: Secondary | ICD-10-CM | POA: Diagnosis not present

## 2021-06-02 DIAGNOSIS — Z299 Encounter for prophylactic measures, unspecified: Secondary | ICD-10-CM | POA: Diagnosis not present

## 2021-06-02 DIAGNOSIS — I10 Essential (primary) hypertension: Secondary | ICD-10-CM | POA: Diagnosis not present

## 2021-06-02 DIAGNOSIS — K5900 Constipation, unspecified: Secondary | ICD-10-CM | POA: Diagnosis not present

## 2021-06-02 DIAGNOSIS — R002 Palpitations: Secondary | ICD-10-CM | POA: Diagnosis not present

## 2021-06-29 DIAGNOSIS — Z1331 Encounter for screening for depression: Secondary | ICD-10-CM | POA: Diagnosis not present

## 2021-06-29 DIAGNOSIS — Z0000 Encounter for general adult medical examination without abnormal findings: Secondary | ICD-10-CM | POA: Diagnosis not present

## 2021-06-29 DIAGNOSIS — Z789 Other specified health status: Secondary | ICD-10-CM | POA: Diagnosis not present

## 2021-06-29 DIAGNOSIS — I10 Essential (primary) hypertension: Secondary | ICD-10-CM | POA: Diagnosis not present

## 2021-06-29 DIAGNOSIS — Z1339 Encounter for screening examination for other mental health and behavioral disorders: Secondary | ICD-10-CM | POA: Diagnosis not present

## 2021-06-29 DIAGNOSIS — Z299 Encounter for prophylactic measures, unspecified: Secondary | ICD-10-CM | POA: Diagnosis not present

## 2021-06-29 DIAGNOSIS — Z6835 Body mass index (BMI) 35.0-35.9, adult: Secondary | ICD-10-CM | POA: Diagnosis not present

## 2021-06-29 DIAGNOSIS — Z7189 Other specified counseling: Secondary | ICD-10-CM | POA: Diagnosis not present

## 2021-06-30 DIAGNOSIS — E1165 Type 2 diabetes mellitus with hyperglycemia: Secondary | ICD-10-CM | POA: Diagnosis not present

## 2021-07-01 DIAGNOSIS — E7800 Pure hypercholesterolemia, unspecified: Secondary | ICD-10-CM | POA: Diagnosis not present

## 2021-07-01 DIAGNOSIS — Z125 Encounter for screening for malignant neoplasm of prostate: Secondary | ICD-10-CM | POA: Diagnosis not present

## 2021-07-01 DIAGNOSIS — Z79899 Other long term (current) drug therapy: Secondary | ICD-10-CM | POA: Diagnosis not present

## 2021-07-01 DIAGNOSIS — R5383 Other fatigue: Secondary | ICD-10-CM | POA: Diagnosis not present

## 2021-07-08 DIAGNOSIS — Z1211 Encounter for screening for malignant neoplasm of colon: Secondary | ICD-10-CM | POA: Diagnosis not present

## 2021-07-08 DIAGNOSIS — Z1212 Encounter for screening for malignant neoplasm of rectum: Secondary | ICD-10-CM | POA: Diagnosis not present

## 2021-07-12 DIAGNOSIS — R010 Benign and innocent cardiac murmurs: Secondary | ICD-10-CM | POA: Diagnosis not present

## 2021-07-19 DIAGNOSIS — E1165 Type 2 diabetes mellitus with hyperglycemia: Secondary | ICD-10-CM | POA: Diagnosis not present

## 2021-07-19 DIAGNOSIS — Z299 Encounter for prophylactic measures, unspecified: Secondary | ICD-10-CM | POA: Diagnosis not present

## 2021-07-19 DIAGNOSIS — R0600 Dyspnea, unspecified: Secondary | ICD-10-CM | POA: Diagnosis not present

## 2021-07-19 DIAGNOSIS — I10 Essential (primary) hypertension: Secondary | ICD-10-CM | POA: Diagnosis not present

## 2021-07-19 DIAGNOSIS — I77810 Thoracic aortic ectasia: Secondary | ICD-10-CM | POA: Diagnosis not present

## 2021-07-30 DIAGNOSIS — E1165 Type 2 diabetes mellitus with hyperglycemia: Secondary | ICD-10-CM | POA: Diagnosis not present

## 2021-08-04 DIAGNOSIS — R079 Chest pain, unspecified: Secondary | ICD-10-CM | POA: Diagnosis not present

## 2021-08-04 DIAGNOSIS — E119 Type 2 diabetes mellitus without complications: Secondary | ICD-10-CM | POA: Diagnosis not present

## 2021-08-04 DIAGNOSIS — Z1211 Encounter for screening for malignant neoplasm of colon: Secondary | ICD-10-CM | POA: Diagnosis not present

## 2021-08-10 ENCOUNTER — Encounter: Payer: Self-pay | Admitting: Cardiology

## 2021-08-10 ENCOUNTER — Ambulatory Visit: Payer: Medicare Other | Admitting: Cardiology

## 2021-08-10 ENCOUNTER — Inpatient Hospital Stay: Payer: Medicare Other

## 2021-08-10 ENCOUNTER — Other Ambulatory Visit: Payer: Self-pay

## 2021-08-10 VITALS — BP 153/77 | HR 90 | Temp 98.2°F | Resp 16 | Ht 71.0 in | Wt 240.8 lb

## 2021-08-10 DIAGNOSIS — E1169 Type 2 diabetes mellitus with other specified complication: Secondary | ICD-10-CM | POA: Diagnosis not present

## 2021-08-10 DIAGNOSIS — E119 Type 2 diabetes mellitus without complications: Secondary | ICD-10-CM

## 2021-08-10 DIAGNOSIS — R002 Palpitations: Secondary | ICD-10-CM | POA: Diagnosis not present

## 2021-08-10 DIAGNOSIS — E785 Hyperlipidemia, unspecified: Secondary | ICD-10-CM | POA: Diagnosis not present

## 2021-08-10 DIAGNOSIS — I10 Essential (primary) hypertension: Secondary | ICD-10-CM | POA: Diagnosis not present

## 2021-08-10 DIAGNOSIS — R9431 Abnormal electrocardiogram [ECG] [EKG]: Secondary | ICD-10-CM

## 2021-08-10 DIAGNOSIS — R0602 Shortness of breath: Secondary | ICD-10-CM | POA: Diagnosis not present

## 2021-08-10 NOTE — Progress Notes (Signed)
Date:  08/10/2021   ID:  Brentt Fread Bragdon, DOB 1956-07-08, MRN [id number]  PCP:  Ignatius Specking, MD  Cardiologist:  Tessa Lerner, DO, [facility] (established care 08/10/2021)  REASON FOR CONSULT: Dyspnea.   REQUESTING PHYSICIAN:  Ignatius Specking, MD [address] [place],  [place] [address]  Chief Complaint  Patient presents with   Shortness of Breath   New Patient (Initial Visit)    HPI  Anthony Mayer is a 66 y.o. Caucasian male who presents to the office with a chief complaint of "skipped beats and shortness of breath." Patient's past medical history and cardiovascular risk factors include: NIDDM Type II, nephrolithiasis, obesity due to excess calories, hypertension, hypercholesterolemia, obesity due to excess calories.   He is referred to the office at the request of Anthony Beam B, MD for evaluation of exertional dyspnea.  Patient states that he is been experiencing palpitations/skipped beats since December 2022.  They happen once or twice a week, lasting for few seconds no more than a minute, self-limited.  Symptoms are associated with shortness of breath.  He denies any near-syncope or syncopal event.  Patient denies any chest pain or shortness of breath at rest or with effort related activities.   Office blood pressures are not well controlled.  Home blood pressures are between 130-140 mmHg and diastolic blood pressures are between 70-80 mmHg.  He walks at least 1-1.5 miles on a daily basis.  He has not noticed any significant change in overall physical endurance.  He had an echocardiogram in outside facility and based on the report LVEF appears to be preserved, diastolic dysfunction is reported as impaired, trace aortic regurgitation, dilated aortic root at 3.9 cm and ascending aorta documented to be 3.5 cm.  No family history of premature coronary disease or sudden cardiac death.  FUNCTIONAL STATUS: Walks 1-1.5 miles daily.  ALLERGIES: No Known Allergies  MEDICATION LIST PRIOR TO  VISIT: Current Meds  Medication Sig   aspirin 81 MG EC tablet Take by mouth.   cyanocobalamin 1000 MCG tablet Take by mouth.   glipiZIDE (GLUCOTROL) 10 MG tablet Take 1 tablet by mouth daily.   glucose blood (ONETOUCH ULTRA) test strip USE 1 STRIP TO CHECK GLUCOSE TWICE DAILY   losartan (COZAAR) 25 MG tablet Take 12.5 mg by mouth daily.   metFORMIN (GLUCOPHAGE) 500 MG tablet Take 2 tablets by mouth daily.   Misc Natural Products (CANDICIDAL) CAPS Take by mouth.   Propylene Glycol 0.6 % SOLN 1 drop.   rosuvastatin (CRESTOR) 5 MG tablet Take 5 mg by mouth once a week.   tamsulosin (FLOMAX) 0.4 MG CAPS capsule Take 0.4 mg by mouth 2 (two) times daily.   [DISCONTINUED] lisinopril (ZESTRIL) 10 MG tablet Take by mouth.     PAST MEDICAL HISTORY: Past Medical History:  Diagnosis Date   Diabetes (HCC)    Hyperlipidemia    Hypertension     PAST SURGICAL HISTORY: Past Surgical History:  Procedure Laterality Date   CHOLECYSTECTOMY     KNEE SURGERY Right    SHOULDER SURGERY      FAMILY HISTORY: The patient family history includes Heart attack in his father; Stroke in his mother.  SOCIAL HISTORY:  The patient  reports that he has never smoked. He has never used smokeless tobacco. He reports that he does not drink alcohol and does not use drugs.  REVIEW OF SYSTEMS: Review of Systems  Constitutional: Negative for chills and fever.  HENT:  Negative for hoarse voice  and nosebleeds.   Eyes:  Negative for discharge, double vision and pain.  Cardiovascular:  Positive for palpitations. Negative for chest pain, claudication, dyspnea on exertion, leg swelling, near-syncope, orthopnea, paroxysmal nocturnal dyspnea and syncope.  Respiratory:  Positive for shortness of breath. Negative for hemoptysis.   Musculoskeletal:  Negative for muscle cramps and myalgias.  Gastrointestinal:  Negative for abdominal pain, constipation, diarrhea, hematemesis, hematochezia, melena, nausea and vomiting.   Neurological:  Negative for dizziness and light-headedness.   PHYSICAL EXAM: Vitals with BMI 08/10/2021 08/10/2021  Height - 5\' 11"   Weight - 240 lbs 13 oz  BMI - 33.6  Systolic 153 166  Diastolic 77 78  Pulse 90 84    CONSTITUTIONAL: Well-developed and well-nourished. No acute distress.  SKIN: Skin is warm and dry. No rash noted. No cyanosis. No pallor. No jaundice HEAD: Normocephalic and atraumatic.  EYES: No scleral icterus MOUTH/THROAT: Moist oral membranes.  NECK: No JVD present. No thyromegaly noted. No carotid bruits  LYMPHATIC: No visible cervical adenopathy.  CHEST Normal respiratory effort. No intercostal retractions  LUNGS: Clear to auscultation bilaterally.  No stridor. No wheezes. No rales.  CARDIOVASCULAR: Regular rate and rhythm, positive S1-S2, no murmurs rubs or gallops appreciated. ABDOMINAL: Obese, soft, nontender, nondistended, positive bowel sounds all 4 quadrants. No apparent ascites.  EXTREMITIES: No peripheral edema, warm to touch, +2 bilateral DP and PT pulses HEMATOLOGIC: No significant bruising NEUROLOGIC: Oriented to person, place, and time. Nonfocal. Normal muscle tone.  PSYCHIATRIC: Normal mood and affect. Normal behavior. Cooperative  CARDIAC DATABASE: EKG: 08/10/2021: Normal sinus rhythm, 80 bpm, consider old anteroseptal infarct, without underlying injury pattern.  [facility] per report: LVEF 60%, impaired relaxation/diastolic function, mildly dilated left atrium, dilated aortic root (39 mm), trace AI   Stress Testing: No results found for this or any previous visit from the past 1095 days.   Heart Catheterization: None  LABORATORY DATA: No flowsheet data found.  No flowsheet data found.  Lipid Panel  No results found for: CHOL, TRIG, HDL, CHOLHDL, VLDL, LDLCALC, LDLDIRECT, LABVLDL  No components found for: NTPROBNP No results for input(s): PROBNP in the last 8760 hours. No results  for input(s): TSH in the last 8760 hours.  BMP No results for input(s): NA, K, CL, CO2, GLUCOSE, BUN, CREATININE, CALCIUM, GFRNONAA, GFRAA in the last 8760 hours.  HEMOGLOBIN A1C No results found for: HGBA1C, MPG  IMPRESSION:    ICD-10-CM   1. Shortness of breath  R06.02 EKG 12-Lead    PCV MYOCARDIAL PERFUSION WO LEXISCAN    2. Palpitations  R00.2 LONG TERM MONITOR (3-14 DAYS)    PCV MYOCARDIAL PERFUSION WO LEXISCAN    3. Non-insulin dependent type 2 diabetes mellitus (HCC)  E11.9 PCV MYOCARDIAL PERFUSION WO LEXISCAN    4. Benign hypertension  I10     5. Type 2 diabetes mellitus with hyperlipidemia (HCC)  E11.69 CT CARDIAC SCORING ([facility] LOCATIONS ONLY)   E78.5     6. Nonspecific abnormal electrocardiogram (ECG) (EKG)  R94.31 PCV MYOCARDIAL PERFUSION WO LEXISCAN       RECOMMENDATIONS: Anthony Mayer is a 66 y.o. Caucasian male whose past medical history and cardiac risk factors include: NIDDM Type II, nephrolithiasis, obesity due to excess calories, hypertension, hypercholesterolemia, obesity due to excess calories.   Shortness of breath Predominantly associated with symptoms of skipped beats or palpitations according to the patient. Referred to cardiology for evaluation of dyspnea on exertion given his multiple cardiovascular risk factors including hyperlipidemia, non-insulin-dependent diabetes,  advanced age and obesity. Outside echo results reviewed and per report LVEF is preserved, diastolic function is impaired, and trace AI. Patient recently had labs with PCP, records requested.  Otherwise recommend checking TSH, BNP. Recommend exercise nuclear stress test as the patient is able to exercise, baseline EKG nondiagnostic, intermediate risk for CAD given his risk factors.  Palpitations We will proceed with 14-day extended Holter monitor to evaluate for dysrhythmias. No identifiable reversible cause. Outside labs performed in November 2022 requested from PCPs office.  Otherwise  we will need to check TSH and electrolytes  Non-insulin dependent type 2 diabetes mellitus (HCC) Currently on statin therapy and ARB. Educated regarding the importance of glycemic control. Currently managed by primary care provider.  Benign hypertension Office blood pressures are not well controlled. Home blood pressures are between 130-140 mmHg.   Recommend better blood pressure control given his diabetes and mildly dilated aortic root at 39 mm. Recommended increasing losartan to 25 mg p.o. daily, keep a log of his BP, and review with PCP or myself at the next visit. Educated the importance of a low-salt diet. If additional medical therapy is warranted would recommend either metoprolol or labetalol given his mildly dilated aorta.  Type 2 diabetes mellitus with hyperlipidemia (HCC) Currently takes Crestor 5 mg p.o. q. weekly.  He is concerned about side effect profile.  He does not endorse myalgias or other side effects but he is concerned of the potential effects. Will request his labs from his PCPs office to review his lipid profile. Recommend that he take Crestor 5 mg p.o. daily if possible and transition to a different statin medication. Will discuss it further with PCP.  FINAL MEDICATION LIST END OF ENCOUNTER: No orders of the defined types were placed in this encounter.   Medications Discontinued During This Encounter  Medication Reason   lisinopril (ZESTRIL) 10 MG tablet Patient Preference     Current Outpatient Medications:    aspirin 81 MG EC tablet, Take by mouth., Disp: , Rfl:    cyanocobalamin 1000 MCG tablet, Take by mouth., Disp: , Rfl:    glipiZIDE (GLUCOTROL) 10 MG tablet, Take 1 tablet by mouth daily., Disp: , Rfl:    glucose blood (ONETOUCH ULTRA) test strip, USE 1 STRIP TO CHECK GLUCOSE TWICE DAILY, Disp: , Rfl:    losartan (COZAAR) 25 MG tablet, Take 12.5 mg by mouth daily., Disp: , Rfl:    metFORMIN (GLUCOPHAGE) 500 MG tablet, Take 2 tablets by mouth daily.,  Disp: , Rfl:    Misc Natural Products (CANDICIDAL) CAPS, Take by mouth., Disp: , Rfl:    Propylene Glycol 0.6 % SOLN, 1 drop., Disp: , Rfl:    rosuvastatin (CRESTOR) 5 MG tablet, Take 5 mg by mouth once a week., Disp: , Rfl:    tamsulosin (FLOMAX) 0.4 MG CAPS capsule, Take 0.4 mg by mouth 2 (two) times daily., Disp: , Rfl:   Orders Placed This Encounter  Procedures   CT CARDIAC SCORING ([facility] LOCATIONS ONLY)   LONG TERM MONITOR (3-14 DAYS)   PCV MYOCARDIAL PERFUSION WO LEXISCAN   EKG 12-Lead    There are no Patient Instructions on file for this visit.   --Continue cardiac medications as reconciled in final medication list. --Return in about 6 weeks (around 09/21/2021) for Follow up, Dyspnea, Review test results. Or sooner if needed. --Continue follow-up with your primary care physician regarding the management of your other chronic comorbid conditions.  Patient's questions and concerns were addressed to his satisfaction. He voices  understanding of the instructions provided during this encounter.   This note was created using a voice recognition software as a result there may be grammatical errors inadvertently enclosed that do not reflect the nature of this encounter. Every attempt is made to correct such errors.  Anthony Mayer, [place], [facility]  Pager: [phone] Office: [phone]

## 2021-08-29 DIAGNOSIS — E1165 Type 2 diabetes mellitus with hyperglycemia: Secondary | ICD-10-CM | POA: Diagnosis not present

## 2021-08-30 DIAGNOSIS — R002 Palpitations: Secondary | ICD-10-CM | POA: Diagnosis not present

## 2021-08-31 DIAGNOSIS — H43822 Vitreomacular adhesion, left eye: Secondary | ICD-10-CM | POA: Diagnosis not present

## 2021-08-31 DIAGNOSIS — H25813 Combined forms of age-related cataract, bilateral: Secondary | ICD-10-CM | POA: Diagnosis not present

## 2021-08-31 DIAGNOSIS — H2511 Age-related nuclear cataract, right eye: Secondary | ICD-10-CM | POA: Diagnosis not present

## 2021-08-31 DIAGNOSIS — Z9842 Cataract extraction status, left eye: Secondary | ICD-10-CM | POA: Diagnosis not present

## 2021-08-31 DIAGNOSIS — E113212 Type 2 diabetes mellitus with mild nonproliferative diabetic retinopathy with macular edema, left eye: Secondary | ICD-10-CM | POA: Diagnosis not present

## 2021-08-31 DIAGNOSIS — H35372 Puckering of macula, left eye: Secondary | ICD-10-CM | POA: Diagnosis not present

## 2021-09-03 ENCOUNTER — Ambulatory Visit
Admission: RE | Admit: 2021-09-03 | Discharge: 2021-09-03 | Disposition: A | Discharge status: Home / Self Care | Payer: No Typology Code available for payment source | Source: Ambulatory Visit | Attending: Cardiology | Admitting: Cardiology

## 2021-09-03 DIAGNOSIS — E1169 Type 2 diabetes mellitus with other specified complication: Secondary | ICD-10-CM

## 2021-09-03 DIAGNOSIS — E785 Hyperlipidemia, unspecified: Secondary | ICD-10-CM

## 2021-09-06 ENCOUNTER — Other Ambulatory Visit: Payer: Medicare Other

## 2021-09-07 ENCOUNTER — Ambulatory Visit: Payer: Medicare Other

## 2021-09-07 ENCOUNTER — Other Ambulatory Visit: Payer: Self-pay

## 2021-09-07 DIAGNOSIS — E119 Type 2 diabetes mellitus without complications: Secondary | ICD-10-CM

## 2021-09-07 DIAGNOSIS — R002 Palpitations: Secondary | ICD-10-CM | POA: Diagnosis not present

## 2021-09-07 DIAGNOSIS — R9431 Abnormal electrocardiogram [ECG] [EKG]: Secondary | ICD-10-CM | POA: Diagnosis not present

## 2021-09-07 DIAGNOSIS — R0602 Shortness of breath: Secondary | ICD-10-CM

## 2021-09-12 DIAGNOSIS — R002 Palpitations: Secondary | ICD-10-CM | POA: Diagnosis not present

## 2021-09-13 NOTE — Progress Notes (Signed)
Called and spoke with patient regarding his stress test results. Patient confirmed he will be here at his scheduled appointment.

## 2021-09-13 NOTE — Progress Notes (Signed)
Patient confirmed, he will be here at his scheduled appointment.

## 2021-09-21 DIAGNOSIS — I77810 Thoracic aortic ectasia: Secondary | ICD-10-CM | POA: Diagnosis not present

## 2021-09-21 DIAGNOSIS — E1165 Type 2 diabetes mellitus with hyperglycemia: Secondary | ICD-10-CM | POA: Diagnosis not present

## 2021-09-21 DIAGNOSIS — Z299 Encounter for prophylactic measures, unspecified: Secondary | ICD-10-CM | POA: Diagnosis not present

## 2021-09-21 DIAGNOSIS — D692 Other nonthrombocytopenic purpura: Secondary | ICD-10-CM | POA: Diagnosis not present

## 2021-09-21 DIAGNOSIS — I10 Essential (primary) hypertension: Secondary | ICD-10-CM | POA: Diagnosis not present

## 2021-09-23 ENCOUNTER — Encounter: Payer: Self-pay | Admitting: Cardiology

## 2021-09-23 ENCOUNTER — Other Ambulatory Visit: Payer: Self-pay

## 2021-09-23 ENCOUNTER — Ambulatory Visit: Payer: Medicare Other | Admitting: Cardiology

## 2021-09-23 VITALS — BP 147/70 | HR 80 | Temp 98.2°F | Resp 16 | Ht 71.0 in | Wt 239.0 lb

## 2021-09-23 DIAGNOSIS — R002 Palpitations: Secondary | ICD-10-CM

## 2021-09-23 DIAGNOSIS — E1169 Type 2 diabetes mellitus with other specified complication: Secondary | ICD-10-CM | POA: Diagnosis not present

## 2021-09-23 DIAGNOSIS — E119 Type 2 diabetes mellitus without complications: Secondary | ICD-10-CM | POA: Diagnosis not present

## 2021-09-23 DIAGNOSIS — R9431 Abnormal electrocardiogram [ECG] [EKG]: Secondary | ICD-10-CM | POA: Diagnosis not present

## 2021-09-23 DIAGNOSIS — I10 Essential (primary) hypertension: Secondary | ICD-10-CM

## 2021-09-23 DIAGNOSIS — R931 Abnormal findings on diagnostic imaging of heart and coronary circulation: Secondary | ICD-10-CM

## 2021-09-23 DIAGNOSIS — R0602 Shortness of breath: Secondary | ICD-10-CM | POA: Diagnosis not present

## 2021-09-23 DIAGNOSIS — E785 Hyperlipidemia, unspecified: Secondary | ICD-10-CM | POA: Diagnosis not present

## 2021-09-23 NOTE — Progress Notes (Signed)
Date:  09/23/2021   ID:  Anthony Mayer, DOB 06-28-1956, MRN [id number]  PCP:  Glenda Chroman, MD  Cardiologist:  Rex Kras, DO, [facility] (established care 08/10/2021)  Date: 09/23/21 Last Office Visit: 08/10/2021  Chief Complaint  Patient presents with   Shortness of Breath   Results   Follow-up    HPI  Anthony Mayer is a 66 y.o. Caucasian male who presents to the office with a chief complaint of "reevaulation of shortness of breath and review test results." Patient's past medical history and cardiovascular risk factors include: NIDDM Type II, nephrolithiasis, obesity due to excess calories, hypertension, hypercholesterolemia, obesity due to excess calories.   He is referred to the office at the request of Jerene Bears B, MD for evaluation of exertional dyspnea.  Patient was referred to cardiology for evaluation of dyspnea on exertion given his multiple cardiovascular risk factors including hyperlipidemia, diabetes, advanced age and obesity.  Outside echocardiogram noted preserved LVEF, on diastolic dysfunction reported to be impaired, and trace AI.  Since last office visit patient states that his symptoms of shortness of breath has essentially resolved and he is no longer experiencing palpitations either.  He denies angina pectoris.  Results of the stress test and extended Holter monitor reviewed with him in great detail and noted below for further reference.  Patient's blood pressure is currently elevated he is prescribed losartan 25 mg p.o. daily but instead takes 12.5 mg p.o. daily.  In addition he is noted to have moderate coronary artery calcification with a total CAC of 249 AU placing him at the 64th percentile.  He is on rosuvastatin 5 mg which she takes on a weekly basis.  FUNCTIONAL STATUS: Walks 1-1.5 miles daily.  ALLERGIES: No Known Allergies  MEDICATION LIST PRIOR TO VISIT: Current Meds  Medication Sig   aspirin 81 MG EC tablet Take by mouth.   glipiZIDE (GLUCOTROL)  10 MG tablet Take 1 tablet by mouth daily.   glucose blood (ONETOUCH ULTRA) test strip USE 1 STRIP TO CHECK GLUCOSE TWICE DAILY   losartan (COZAAR) 25 MG tablet Take 25 mg by mouth daily.   metFORMIN (GLUCOPHAGE) 500 MG tablet Take 2 tablets by mouth daily.   Misc Natural Products (CANDICIDAL) CAPS Take by mouth.   Propylene Glycol 0.6 % SOLN 1 drop.   rosuvastatin (CRESTOR) 5 MG tablet Take 5 mg by mouth at bedtime.   tamsulosin (FLOMAX) 0.4 MG CAPS capsule Take 0.4 mg by mouth 2 (two) times daily.     PAST MEDICAL HISTORY: Past Medical History:  Diagnosis Date   Diabetes ([place])    Hyperlipidemia    Hypertension     PAST SURGICAL HISTORY: Past Surgical History:  Procedure Laterality Date   CHOLECYSTECTOMY     KNEE SURGERY Right    SHOULDER SURGERY      FAMILY HISTORY: The patient family history includes Heart attack in his father; Stroke in his mother.  SOCIAL HISTORY:  The patient  reports that he has never smoked. He has never used smokeless tobacco. He reports that he does not drink alcohol and does not use drugs.  REVIEW OF SYSTEMS: Review of Systems  Cardiovascular:  Negative for chest pain, dyspnea on exertion, irregular heartbeat, leg swelling, orthopnea, palpitations, paroxysmal nocturnal dyspnea and syncope.  Respiratory:  Negative for cough, shortness of breath and wheezing.   Hematologic/Lymphatic: Negative for bleeding problem.  Neurological:  Negative for dizziness and light-headedness.   PHYSICAL EXAM: Vitals with BMI 09/23/2021 08/10/2021 08/10/2021  Height 5\' 11"  - 5\' 11"   Weight 239 lbs - 240 lbs 13 oz  BMI 123XX123 - 123XX123  Systolic [id number]  Diastolic 70 77 78  Pulse 80 90 84    CONSTITUTIONAL: Well-developed and well-nourished. No acute distress.  SKIN: Skin is warm and dry. No rash noted. No cyanosis. No pallor. No jaundice HEAD: Normocephalic and atraumatic.  EYES: No scleral icterus MOUTH/THROAT: Moist oral membranes.  NECK: No JVD present. No  thyromegaly noted. No carotid bruits  LYMPHATIC: No visible cervical adenopathy.  CHEST Normal respiratory effort. No intercostal retractions  LUNGS: Clear to auscultation bilaterally.  No stridor. No wheezes. No rales.  CARDIOVASCULAR: Regular rate and rhythm, positive S1-S2, no murmurs rubs or gallops appreciated. ABDOMINAL: Obese, soft, nontender, nondistended, positive bowel sounds all 4 quadrants. No apparent ascites.  EXTREMITIES: No peripheral edema, warm to touch, +2 bilateral DP and PT pulses HEMATOLOGIC: No significant bruising NEUROLOGIC: Oriented to person, place, and time. Nonfocal. Normal muscle tone.  PSYCHIATRIC: Normal mood and affect. Normal behavior. Cooperative  No change in physical examination since last office visit.  CARDIAC DATABASE: EKG: 08/10/2021: Normal sinus rhythm, 80 bpm, consider old anteroseptal infarct, without underlying injury pattern.  Echocardiogram: [facility] 07/12/2021 per report: LVEF 60%, impaired relaxation/diastolic function, mildly dilated left atrium, dilated aortic root (39 mm), trace AI   Stress Testing: Exercise Myoview stress test 09/07/2020: Exercise nuclear stress test was performed using Bruce protocol.  1 Day Rest/Stress Protocol. Exercise time 5 minutes 05 seconds on Bruce protocol, achieved 7.05 METS, 87% of APMHR. Stress ECG negative for ischemia.   Normal myocardial perfusion without convincing evidence of reversible myocardial ischemia or prior infarct. Left ventricular size is normal, wall thickness preserved, no regional wall motion abnormalities.  Calculated LVEF 68%. Low risk study.  Heart Catheterization: None  CT Cardiac Scoring: 09/03/2021 Total CAC 249 AU, 64th percentile for patient's age, sex, and race. Noncardiac findings: Scattered calcification in the aortic root and descending aorta  14 day extended Holter monitor: Dominant rhythm normal sinus. Heart rate 51-148 bpm.  Avg HR 74  bpm. No atrial fibrillation, supraventricular or ventricular tachycardia, high grade AV block, pauses (3 seconds or longer). Total ventricular ectopic burden <1%. Total supraventricular ectopic burden <1%. Patient triggered events: 0.   LABORATORY DATA: No flowsheet data found.  No flowsheet data found.  Lipid Panel  No results found for: CHOL, TRIG, HDL, CHOLHDL, VLDL, LDLCALC, LDLDIRECT, LABVLDL  No components found for: NTPROBNP No results for input(s): PROBNP in the last 8760 hours. No results for input(s): TSH in the last 8760 hours.  BMP No results for input(s): NA, K, CL, CO2, GLUCOSE, BUN, CREATININE, CALCIUM, GFRNONAA, GFRAA in the last 8760 hours.  HEMOGLOBIN A1C No results found for: HGBA1C, MPG  IMPRESSION:    ICD-10-CM   1. Agatston CAC score 200-399  R93.1     2. Palpitations  R00.2     3. Shortness of breath  R06.02     4. Non-insulin dependent type 2 diabetes mellitus ([place])  E11.9     5. Benign hypertension  [id number] Basic metabolic panel    Magnesium    6. Type 2 diabetes mellitus with hyperlipidemia (HCC)  E11.69    E78.5     7. Nonspecific abnormal electrocardiogram (ECG) (EKG)  R94.31        RECOMMENDATIONS: Anthony Mayer is a 66 y.o. Caucasian male whose past medical history and cardiac risk factors include: Moderate coronary artery calcification (total  CAC 249/64th percentile), NIDDM Type II, nephrolithiasis, obesity due to excess calories, hypertension, hypercholesterolemia, obesity due to excess calories.   Agatston CAC score 200-399 Total CAC: 249 AU, 69th percentile Continue aspirin 81 mg p.o. daily. Currently taking rosuvastatin 5 mg p.o. q. weekly.  Encouraged him to take it daily at night. Recommend follow-up labs in 6 weeks to reevaluate fasting lipid profile.  Patient states that he has to commute back and forth 60 miles to come to the office and therefore will have it done at PCPs office.  He is encouraged to forward a copy of the  results to our practice for reference. Stress test: Low risk study We will monitor  Palpitations / Shortness of breath Resolved since last office visit  Non-insulin dependent type 2 diabetes mellitus ([place]) Currently on ARB, statin therapy Recommended goal LDL of less than 70 mg/dL. Currently managed by primary care provider.  Benign hypertension Outside echocardiogram reported mildly dilated aortic root at 39 mm. Patient is office blood pressures are not well controlled. Recommend goal systolic blood pressure of approximately 130 mmHg Currently taking losartan 12.5 mg p.o. daily.  I have asked him to increase this to 25 mg p.o. daily until he follows up with PCP. Reemphasized importance of low-salt diet.  Type 2 diabetes mellitus with hyperlipidemia (HCC) Currently taking rosuvastatin 5 mg p.o. q. weekly.  Encouraged him to take it daily given his coronary calcium score. Recommend goal LDL of less than 70 mg/dL.  As part of today's office visit reviewed management of at least 2 chronic comorbid conditions, ordered additional 2 unique diagnostic testing, and reviewed the results of the stress test, extended Holter, and coronary calcium score.  FINAL MEDICATION LIST END OF ENCOUNTER: No orders of the defined types were placed in this encounter.   Medications Discontinued During This Encounter  Medication Reason   cyanocobalamin 1000 MCG tablet      Current Outpatient Medications:    aspirin 81 MG EC tablet, Take by mouth., Disp: , Rfl:    glipiZIDE (GLUCOTROL) 10 MG tablet, Take 1 tablet by mouth daily., Disp: , Rfl:    glucose blood (ONETOUCH ULTRA) test strip, USE 1 STRIP TO CHECK GLUCOSE TWICE DAILY, Disp: , Rfl:    losartan (COZAAR) 25 MG tablet, Take 25 mg by mouth daily., Disp: , Rfl:    metFORMIN (GLUCOPHAGE) 500 MG tablet, Take 2 tablets by mouth daily., Disp: , Rfl:    Misc Natural Products (CANDICIDAL) CAPS, Take by mouth., Disp: , Rfl:    Propylene Glycol 0.6 % SOLN,  1 drop., Disp: , Rfl:    rosuvastatin (CRESTOR) 5 MG tablet, Take 5 mg by mouth at bedtime., Disp: , Rfl:    tamsulosin (FLOMAX) 0.4 MG CAPS capsule, Take 0.4 mg by mouth 2 (two) times daily., Disp: , Rfl:   Orders Placed This Encounter  Procedures   Basic metabolic panel   Magnesium    There are no Patient Instructions on file for this visit.   --Continue cardiac medications as reconciled in final medication list. --Return in about 6 months (around 03/23/2022) for Follow up, Dyspnea, Coronary artery calcification. Or sooner if needed. --Continue follow-up with your primary care physician regarding the management of your other chronic comorbid conditions.  Patient's questions and concerns were addressed to his satisfaction. He voices understanding of the instructions provided during this encounter.   This note was created using a voice recognition software as a result there may be grammatical errors inadvertently enclosed that do  not reflect the nature of this encounter. Every attempt is made to correct such errors.  Rex Kras, [place], [facility]  Pager: [phone] Office: [phone]

## 2021-09-28 DIAGNOSIS — E1165 Type 2 diabetes mellitus with hyperglycemia: Secondary | ICD-10-CM | POA: Diagnosis not present

## 2021-10-08 ENCOUNTER — Other Ambulatory Visit: Payer: Self-pay

## 2021-10-08 DIAGNOSIS — I10 Essential (primary) hypertension: Secondary | ICD-10-CM

## 2021-10-28 DIAGNOSIS — E1165 Type 2 diabetes mellitus with hyperglycemia: Secondary | ICD-10-CM | POA: Diagnosis not present

## 2021-11-11 DIAGNOSIS — Z789 Other specified health status: Secondary | ICD-10-CM | POA: Diagnosis not present

## 2021-11-11 DIAGNOSIS — E7800 Pure hypercholesterolemia, unspecified: Secondary | ICD-10-CM | POA: Diagnosis not present

## 2021-11-11 DIAGNOSIS — Z299 Encounter for prophylactic measures, unspecified: Secondary | ICD-10-CM | POA: Diagnosis not present

## 2021-11-11 DIAGNOSIS — I10 Essential (primary) hypertension: Secondary | ICD-10-CM | POA: Diagnosis not present

## 2021-11-28 DIAGNOSIS — E1165 Type 2 diabetes mellitus with hyperglycemia: Secondary | ICD-10-CM | POA: Diagnosis not present

## 2021-12-28 DIAGNOSIS — E1165 Type 2 diabetes mellitus with hyperglycemia: Secondary | ICD-10-CM | POA: Diagnosis not present

## 2021-12-28 DIAGNOSIS — Z713 Dietary counseling and surveillance: Secondary | ICD-10-CM | POA: Diagnosis not present

## 2021-12-28 DIAGNOSIS — Z299 Encounter for prophylactic measures, unspecified: Secondary | ICD-10-CM | POA: Diagnosis not present

## 2021-12-28 DIAGNOSIS — I10 Essential (primary) hypertension: Secondary | ICD-10-CM | POA: Diagnosis not present

## 2022-01-04 DIAGNOSIS — H6123 Impacted cerumen, bilateral: Secondary | ICD-10-CM | POA: Diagnosis not present

## 2022-01-04 DIAGNOSIS — Z299 Encounter for prophylactic measures, unspecified: Secondary | ICD-10-CM | POA: Diagnosis not present

## 2022-01-04 DIAGNOSIS — Z789 Other specified health status: Secondary | ICD-10-CM | POA: Diagnosis not present

## 2022-01-04 DIAGNOSIS — I10 Essential (primary) hypertension: Secondary | ICD-10-CM | POA: Diagnosis not present

## 2022-01-27 DIAGNOSIS — E1165 Type 2 diabetes mellitus with hyperglycemia: Secondary | ICD-10-CM | POA: Diagnosis not present

## 2022-02-20 ENCOUNTER — Encounter: Payer: Self-pay | Admitting: Cardiology

## 2022-02-28 DIAGNOSIS — E1165 Type 2 diabetes mellitus with hyperglycemia: Secondary | ICD-10-CM | POA: Diagnosis not present

## 2022-03-24 ENCOUNTER — Ambulatory Visit: Payer: Medicare Other | Admitting: Cardiology

## 2022-03-30 DIAGNOSIS — E1165 Type 2 diabetes mellitus with hyperglycemia: Secondary | ICD-10-CM | POA: Diagnosis not present

## 2022-04-08 DIAGNOSIS — Z2821 Immunization not carried out because of patient refusal: Secondary | ICD-10-CM | POA: Diagnosis not present

## 2022-04-08 DIAGNOSIS — I10 Essential (primary) hypertension: Secondary | ICD-10-CM | POA: Diagnosis not present

## 2022-04-08 DIAGNOSIS — E1165 Type 2 diabetes mellitus with hyperglycemia: Secondary | ICD-10-CM | POA: Diagnosis not present

## 2022-04-08 DIAGNOSIS — Z299 Encounter for prophylactic measures, unspecified: Secondary | ICD-10-CM | POA: Diagnosis not present

## 2022-04-29 DIAGNOSIS — E1165 Type 2 diabetes mellitus with hyperglycemia: Secondary | ICD-10-CM | POA: Diagnosis not present

## 2022-05-30 DIAGNOSIS — E1165 Type 2 diabetes mellitus with hyperglycemia: Secondary | ICD-10-CM | POA: Diagnosis not present

## 2022-06-15 DIAGNOSIS — E1165 Type 2 diabetes mellitus with hyperglycemia: Secondary | ICD-10-CM | POA: Diagnosis not present

## 2022-06-15 DIAGNOSIS — Z7189 Other specified counseling: Secondary | ICD-10-CM | POA: Diagnosis not present

## 2022-06-15 DIAGNOSIS — Z789 Other specified health status: Secondary | ICD-10-CM | POA: Diagnosis not present

## 2022-06-15 DIAGNOSIS — I10 Essential (primary) hypertension: Secondary | ICD-10-CM | POA: Diagnosis not present

## 2022-06-15 DIAGNOSIS — Z299 Encounter for prophylactic measures, unspecified: Secondary | ICD-10-CM | POA: Diagnosis not present

## 2022-06-15 DIAGNOSIS — I77810 Thoracic aortic ectasia: Secondary | ICD-10-CM | POA: Diagnosis not present

## 2022-06-15 DIAGNOSIS — Z0000 Encounter for general adult medical examination without abnormal findings: Secondary | ICD-10-CM | POA: Diagnosis not present

## 2022-06-16 DIAGNOSIS — Z79899 Other long term (current) drug therapy: Secondary | ICD-10-CM | POA: Diagnosis not present

## 2022-06-16 DIAGNOSIS — E7800 Pure hypercholesterolemia, unspecified: Secondary | ICD-10-CM | POA: Diagnosis not present

## 2022-06-16 DIAGNOSIS — R5383 Other fatigue: Secondary | ICD-10-CM | POA: Diagnosis not present

## 2022-06-29 DIAGNOSIS — E1165 Type 2 diabetes mellitus with hyperglycemia: Secondary | ICD-10-CM | POA: Diagnosis not present

## 2022-07-22 DIAGNOSIS — Z299 Encounter for prophylactic measures, unspecified: Secondary | ICD-10-CM | POA: Diagnosis not present

## 2022-07-22 DIAGNOSIS — J300 Vasomotor rhinitis: Secondary | ICD-10-CM | POA: Diagnosis not present

## 2022-07-22 DIAGNOSIS — D692 Other nonthrombocytopenic purpura: Secondary | ICD-10-CM | POA: Diagnosis not present

## 2022-07-22 DIAGNOSIS — I10 Essential (primary) hypertension: Secondary | ICD-10-CM | POA: Diagnosis not present

## 2022-07-22 DIAGNOSIS — E1165 Type 2 diabetes mellitus with hyperglycemia: Secondary | ICD-10-CM | POA: Diagnosis not present

## 2022-07-27 ENCOUNTER — Encounter (INDEPENDENT_AMBULATORY_CARE_PROVIDER_SITE_OTHER): Payer: Self-pay | Admitting: *Deleted

## 2022-07-29 DIAGNOSIS — E1165 Type 2 diabetes mellitus with hyperglycemia: Secondary | ICD-10-CM | POA: Diagnosis not present

## 2022-08-09 IMAGING — CT CT CARDIAC CORONARY ARTERY CALCIUM SCORE
3 series · 14 of 20 positions shown, 16 images · non-contrast
Comparison: None.

CLINICAL DATA: Hyperlipidemia

EXAM:
CT CARDIAC CORONARY ARTERY CALCIUM SCORE
TECHNIQUE: Non-contrast imaging through the heart was performed using
prospective ECG gating. Image post processing was performed on an
independent workstation, allowing for quantitative analysis of the
heart and coronary arteries. Note that this exam targets the heart
and the chest was not imaged in its entirety.

[Series 2: calcium scoring 2.00 qr36 bestdiast 69% hrt calciu · axial · 0.43mm/px · z∈[+1532,+1610]mm · 4 of 65 slices shown]
[im 13/65  vessel]
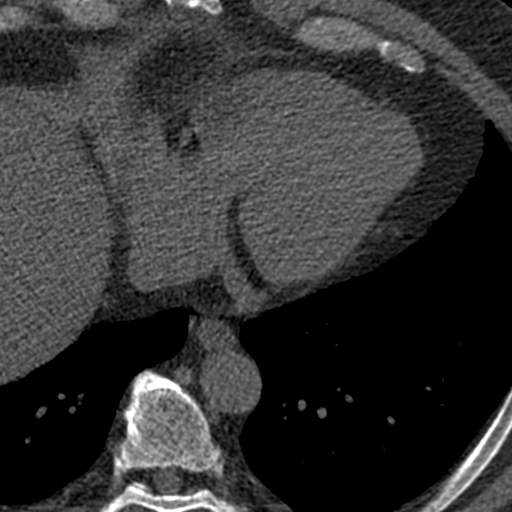
[im 26/65  vessel]
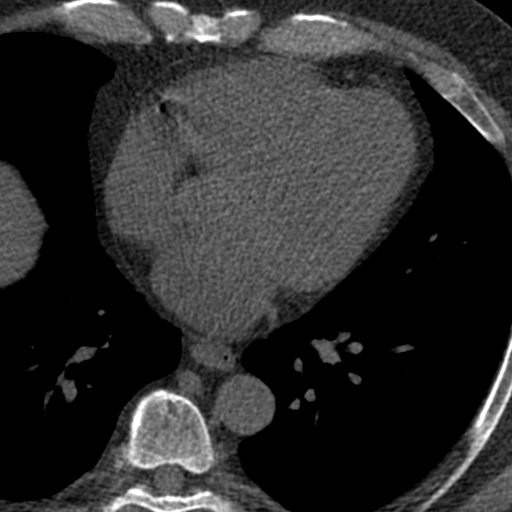
[im 39/65  vessel]
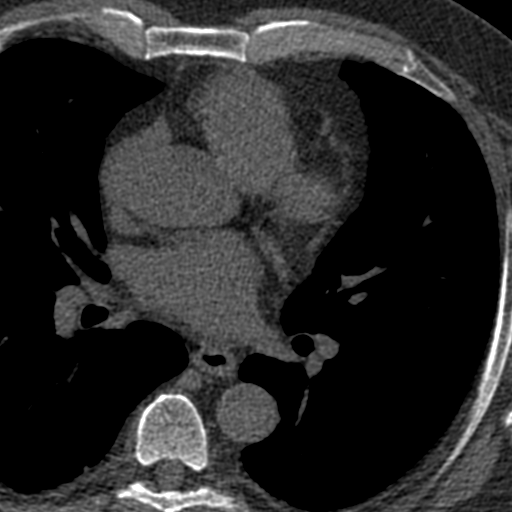
[im 52/65  vessel]
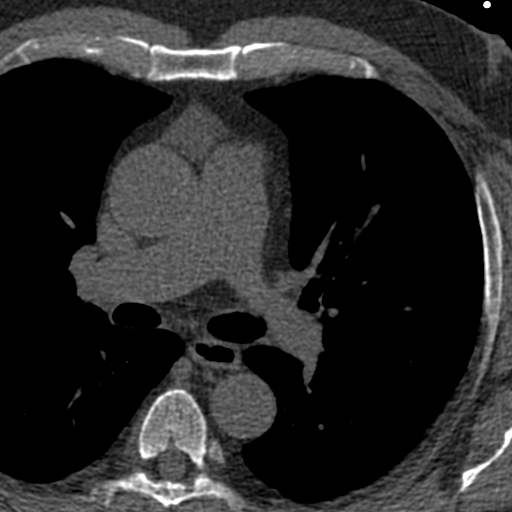

[Series 3: calcium scoring 2.00 br40 bestdiast 69% axial · axial · 0.71mm/px · z∈[+1520,+1612]mm · 5 of 70 slices shown, 7 images]
[im 12/70  vessel]
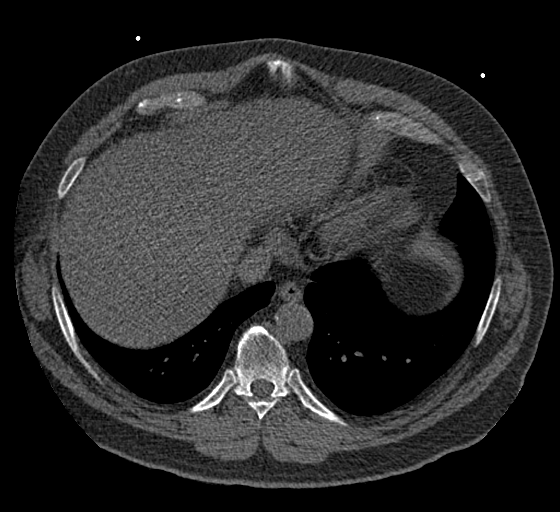
[im 12/70  lung]
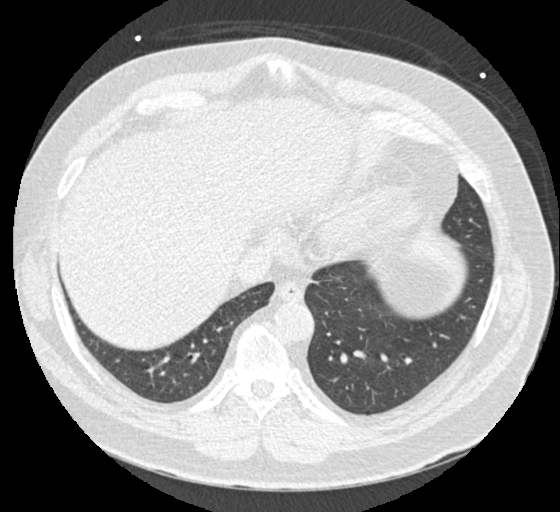
[im 24/70  vessel]
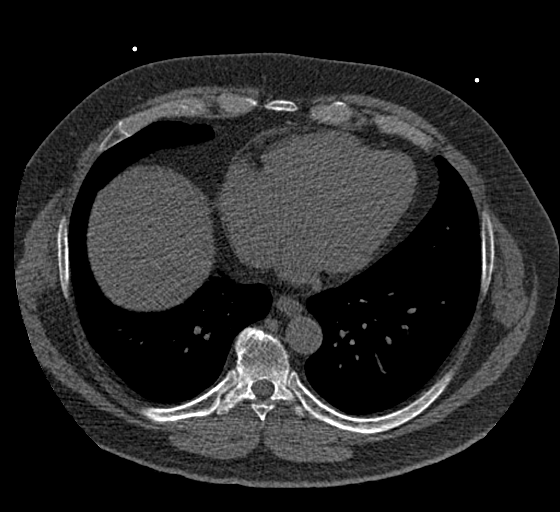
[im 35/70  vessel]
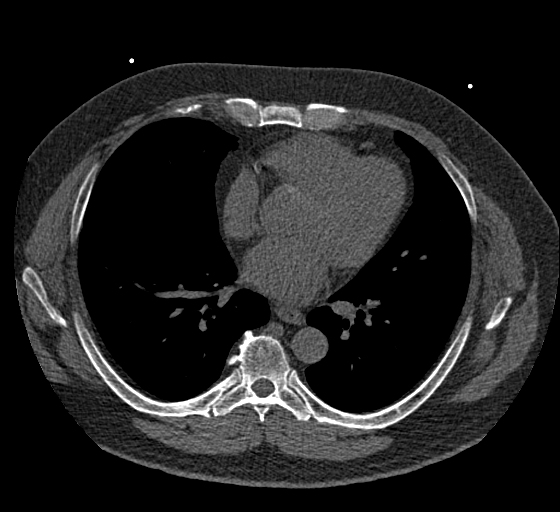
[im 47/70  vessel]
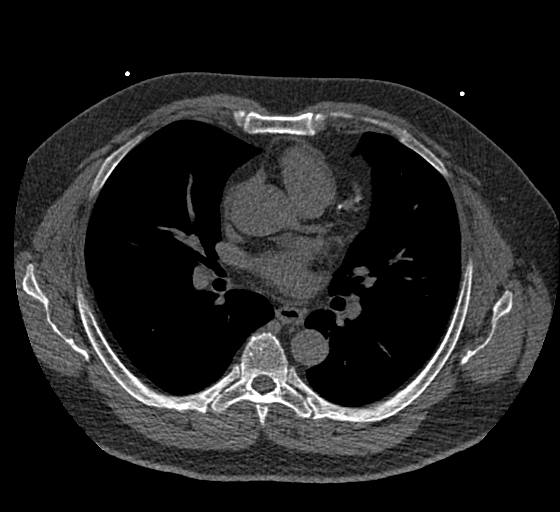
[im 58/70  vessel]
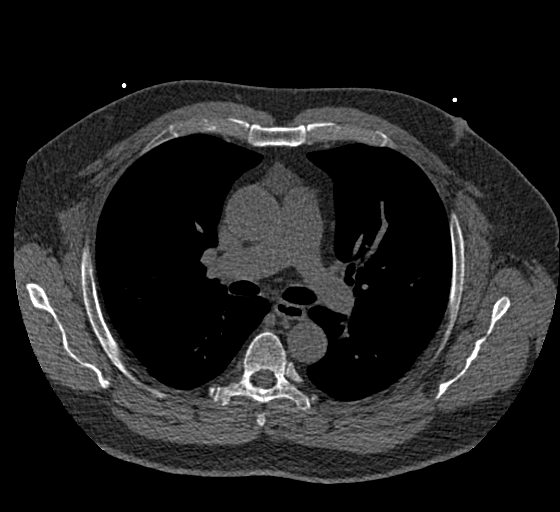
[im 58/70  lung]
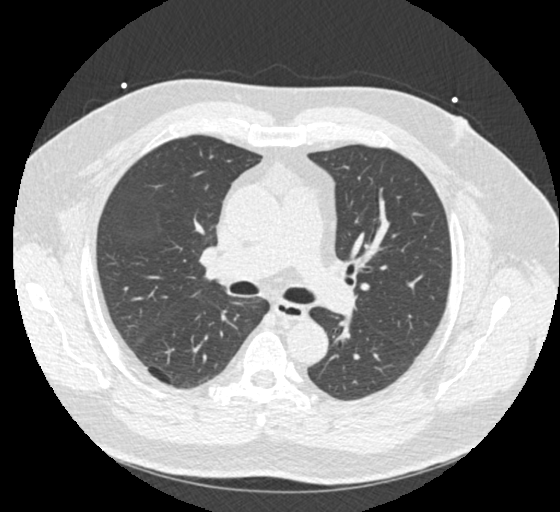

[Series 9: calcium scoring 2.00 br60 bestdiast 69% lungs · axial · 0.71mm/px · z∈[+1520,+1612]mm · 5 of 70 slices shown]
[im 12/70  vessel]
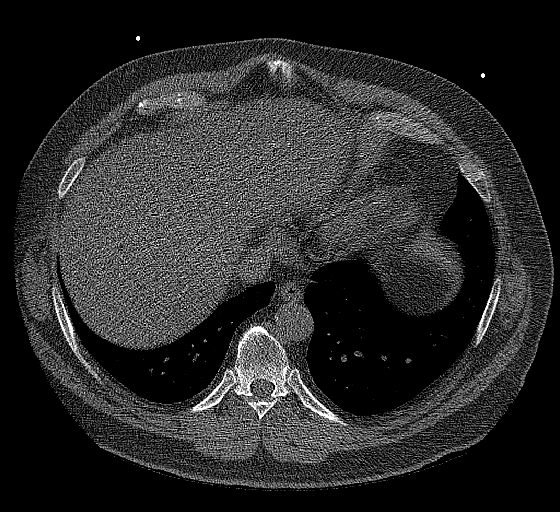
[im 24/70  vessel]
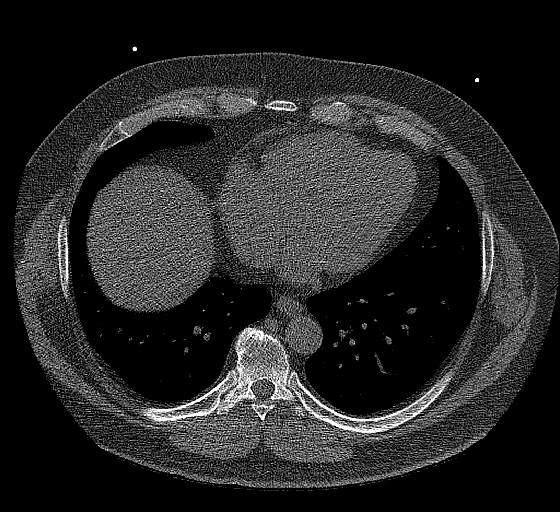
[im 35/70  vessel]
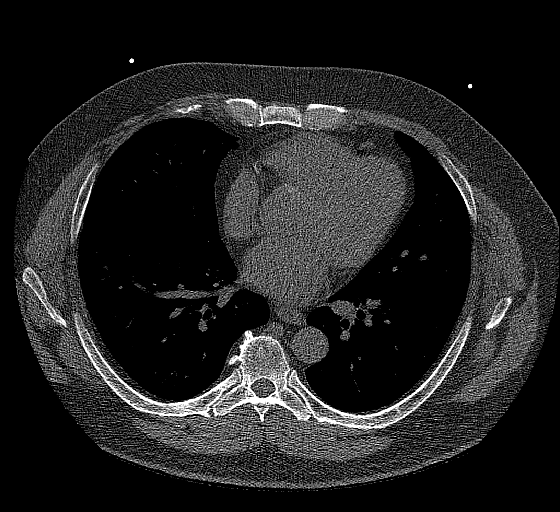
[im 47/70  vessel]
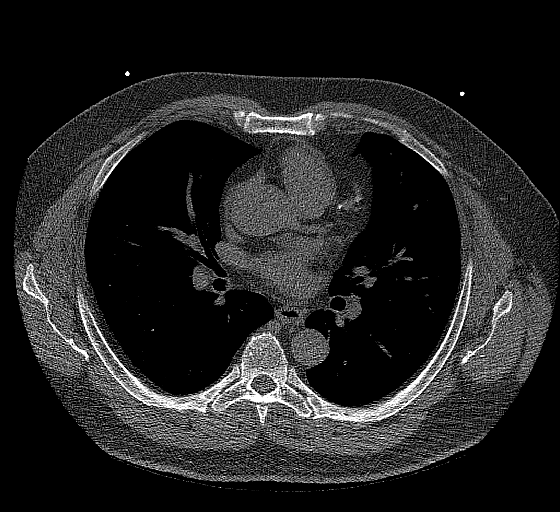
[im 58/70  vessel]
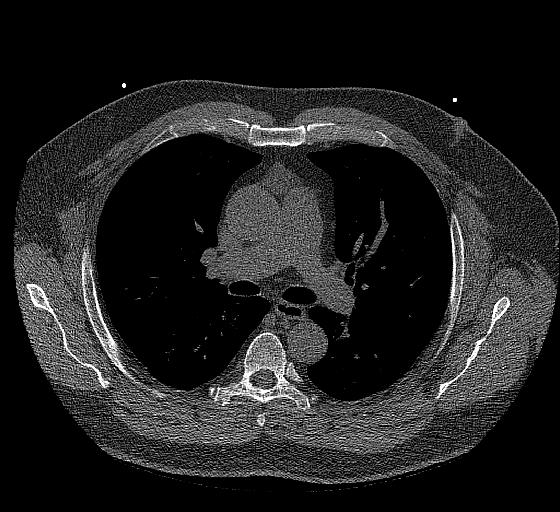

[14 of 20 positions shown; findings below may reference images not displayed]

FINDINGS: CORONARY CALCIUM SCORES:

Left Main: 0

LAD: 207

LCx: 0

RCA: 41

Total Agatston Score: 249

[HOSPITAL] percentile: 64

AORTA MEASUREMENTS:

Ascending Aorta: 37 mm

Descending Aorta: 26 mm

OTHER FINDINGS:

Heart is normal size. Aorta normal caliber. Scattered calcifications
in the aortic root and descending thoracic aorta. No adenopathy. No
confluent airspace opacities or effusions. Imaging into the upper
abdomen demonstrates no acute findings. Chest wall soft tissues are
unremarkable. No acute bony abnormality.
IMPRESSION: Total Agatston score: 249

[HOSPITAL] percentile: 64

Scattered calcifications in the aortic root and descending thoracic
aorta.

No acute findings.

## 2022-08-29 DIAGNOSIS — E1165 Type 2 diabetes mellitus with hyperglycemia: Secondary | ICD-10-CM | POA: Diagnosis not present

## 2022-09-06 DIAGNOSIS — H25813 Combined forms of age-related cataract, bilateral: Secondary | ICD-10-CM | POA: Diagnosis not present

## 2022-09-06 DIAGNOSIS — Z9842 Cataract extraction status, left eye: Secondary | ICD-10-CM | POA: Diagnosis not present

## 2022-09-06 DIAGNOSIS — H2511 Age-related nuclear cataract, right eye: Secondary | ICD-10-CM | POA: Diagnosis not present

## 2022-09-06 DIAGNOSIS — E113212 Type 2 diabetes mellitus with mild nonproliferative diabetic retinopathy with macular edema, left eye: Secondary | ICD-10-CM | POA: Diagnosis not present

## 2022-09-06 DIAGNOSIS — H35372 Puckering of macula, left eye: Secondary | ICD-10-CM | POA: Diagnosis not present

## 2022-09-06 DIAGNOSIS — H43822 Vitreomacular adhesion, left eye: Secondary | ICD-10-CM | POA: Diagnosis not present

## 2022-09-28 DIAGNOSIS — E1165 Type 2 diabetes mellitus with hyperglycemia: Secondary | ICD-10-CM | POA: Diagnosis not present

## 2022-10-06 DIAGNOSIS — Z299 Encounter for prophylactic measures, unspecified: Secondary | ICD-10-CM | POA: Diagnosis not present

## 2022-10-06 DIAGNOSIS — I77810 Thoracic aortic ectasia: Secondary | ICD-10-CM | POA: Diagnosis not present

## 2022-10-06 DIAGNOSIS — I10 Essential (primary) hypertension: Secondary | ICD-10-CM | POA: Diagnosis not present

## 2022-10-06 DIAGNOSIS — K649 Unspecified hemorrhoids: Secondary | ICD-10-CM | POA: Diagnosis not present

## 2022-10-06 DIAGNOSIS — E1165 Type 2 diabetes mellitus with hyperglycemia: Secondary | ICD-10-CM | POA: Diagnosis not present

## 2022-10-27 DIAGNOSIS — Z299 Encounter for prophylactic measures, unspecified: Secondary | ICD-10-CM | POA: Diagnosis not present

## 2022-10-27 DIAGNOSIS — I10 Essential (primary) hypertension: Secondary | ICD-10-CM | POA: Diagnosis not present

## 2022-10-27 DIAGNOSIS — E1159 Type 2 diabetes mellitus with other circulatory complications: Secondary | ICD-10-CM | POA: Diagnosis not present

## 2022-10-27 DIAGNOSIS — I152 Hypertension secondary to endocrine disorders: Secondary | ICD-10-CM | POA: Diagnosis not present

## 2022-10-27 DIAGNOSIS — M545 Low back pain, unspecified: Secondary | ICD-10-CM | POA: Diagnosis not present

## 2022-10-29 DIAGNOSIS — E1165 Type 2 diabetes mellitus with hyperglycemia: Secondary | ICD-10-CM | POA: Diagnosis not present

## 2022-11-29 DIAGNOSIS — E1165 Type 2 diabetes mellitus with hyperglycemia: Secondary | ICD-10-CM | POA: Diagnosis not present

## 2022-12-21 DIAGNOSIS — Z299 Encounter for prophylactic measures, unspecified: Secondary | ICD-10-CM | POA: Diagnosis not present

## 2022-12-21 DIAGNOSIS — Z7189 Other specified counseling: Secondary | ICD-10-CM | POA: Diagnosis not present

## 2022-12-21 DIAGNOSIS — I10 Essential (primary) hypertension: Secondary | ICD-10-CM | POA: Diagnosis not present

## 2022-12-21 DIAGNOSIS — Z0000 Encounter for general adult medical examination without abnormal findings: Secondary | ICD-10-CM | POA: Diagnosis not present

## 2022-12-21 DIAGNOSIS — I77810 Thoracic aortic ectasia: Secondary | ICD-10-CM | POA: Diagnosis not present

## 2022-12-30 DIAGNOSIS — E1165 Type 2 diabetes mellitus with hyperglycemia: Secondary | ICD-10-CM | POA: Diagnosis not present

## 2023-01-02 ENCOUNTER — Encounter (INDEPENDENT_AMBULATORY_CARE_PROVIDER_SITE_OTHER): Payer: Self-pay | Admitting: *Deleted

## 2023-01-29 DIAGNOSIS — E1165 Type 2 diabetes mellitus with hyperglycemia: Secondary | ICD-10-CM | POA: Diagnosis not present

## 2023-02-08 DIAGNOSIS — K649 Unspecified hemorrhoids: Secondary | ICD-10-CM | POA: Diagnosis not present

## 2023-02-08 DIAGNOSIS — I10 Essential (primary) hypertension: Secondary | ICD-10-CM | POA: Diagnosis not present

## 2023-02-08 DIAGNOSIS — Z299 Encounter for prophylactic measures, unspecified: Secondary | ICD-10-CM | POA: Diagnosis not present

## 2023-02-08 DIAGNOSIS — E1165 Type 2 diabetes mellitus with hyperglycemia: Secondary | ICD-10-CM | POA: Diagnosis not present

## 2023-03-01 DIAGNOSIS — E1165 Type 2 diabetes mellitus with hyperglycemia: Secondary | ICD-10-CM | POA: Diagnosis not present

## 2023-03-10 DIAGNOSIS — R52 Pain, unspecified: Secondary | ICD-10-CM | POA: Diagnosis not present

## 2023-03-10 DIAGNOSIS — M545 Low back pain, unspecified: Secondary | ICD-10-CM | POA: Diagnosis not present

## 2023-03-10 DIAGNOSIS — Z299 Encounter for prophylactic measures, unspecified: Secondary | ICD-10-CM | POA: Diagnosis not present

## 2023-03-10 DIAGNOSIS — M47816 Spondylosis without myelopathy or radiculopathy, lumbar region: Secondary | ICD-10-CM | POA: Diagnosis not present

## 2023-03-10 DIAGNOSIS — E1165 Type 2 diabetes mellitus with hyperglycemia: Secondary | ICD-10-CM | POA: Diagnosis not present

## 2023-03-10 DIAGNOSIS — M5431 Sciatica, right side: Secondary | ICD-10-CM | POA: Diagnosis not present

## 2023-03-20 DIAGNOSIS — R079 Chest pain, unspecified: Secondary | ICD-10-CM | POA: Diagnosis not present

## 2023-03-20 DIAGNOSIS — Z743 Need for continuous supervision: Secondary | ICD-10-CM | POA: Diagnosis not present

## 2023-03-20 DIAGNOSIS — R072 Precordial pain: Secondary | ICD-10-CM | POA: Diagnosis not present

## 2023-03-20 DIAGNOSIS — I10 Essential (primary) hypertension: Secondary | ICD-10-CM | POA: Diagnosis not present

## 2023-03-20 DIAGNOSIS — E119 Type 2 diabetes mellitus without complications: Secondary | ICD-10-CM | POA: Diagnosis not present

## 2023-03-20 DIAGNOSIS — R6889 Other general symptoms and signs: Secondary | ICD-10-CM | POA: Diagnosis not present

## 2023-03-20 DIAGNOSIS — M109 Gout, unspecified: Secondary | ICD-10-CM | POA: Diagnosis not present

## 2023-03-20 DIAGNOSIS — R0789 Other chest pain: Secondary | ICD-10-CM | POA: Diagnosis not present

## 2023-03-24 DIAGNOSIS — R52 Pain, unspecified: Secondary | ICD-10-CM | POA: Diagnosis not present

## 2023-03-24 DIAGNOSIS — I10 Essential (primary) hypertension: Secondary | ICD-10-CM | POA: Diagnosis not present

## 2023-03-24 DIAGNOSIS — Z299 Encounter for prophylactic measures, unspecified: Secondary | ICD-10-CM | POA: Diagnosis not present

## 2023-03-24 DIAGNOSIS — R143 Flatulence: Secondary | ICD-10-CM | POA: Diagnosis not present

## 2023-03-24 DIAGNOSIS — M5431 Sciatica, right side: Secondary | ICD-10-CM | POA: Diagnosis not present

## 2023-04-01 DIAGNOSIS — E1165 Type 2 diabetes mellitus with hyperglycemia: Secondary | ICD-10-CM | POA: Diagnosis not present

## 2023-05-01 DIAGNOSIS — E1165 Type 2 diabetes mellitus with hyperglycemia: Secondary | ICD-10-CM | POA: Diagnosis not present

## 2023-05-29 DIAGNOSIS — D692 Other nonthrombocytopenic purpura: Secondary | ICD-10-CM | POA: Diagnosis not present

## 2023-05-29 DIAGNOSIS — Z299 Encounter for prophylactic measures, unspecified: Secondary | ICD-10-CM | POA: Diagnosis not present

## 2023-05-29 DIAGNOSIS — B351 Tinea unguium: Secondary | ICD-10-CM | POA: Diagnosis not present

## 2023-05-29 DIAGNOSIS — E1159 Type 2 diabetes mellitus with other circulatory complications: Secondary | ICD-10-CM | POA: Diagnosis not present

## 2023-05-29 DIAGNOSIS — I77810 Thoracic aortic ectasia: Secondary | ICD-10-CM | POA: Diagnosis not present

## 2023-05-29 DIAGNOSIS — I10 Essential (primary) hypertension: Secondary | ICD-10-CM | POA: Diagnosis not present

## 2023-05-29 DIAGNOSIS — I152 Hypertension secondary to endocrine disorders: Secondary | ICD-10-CM | POA: Diagnosis not present

## 2023-05-31 DIAGNOSIS — E1165 Type 2 diabetes mellitus with hyperglycemia: Secondary | ICD-10-CM | POA: Diagnosis not present

## 2023-06-05 DIAGNOSIS — D485 Neoplasm of uncertain behavior of skin: Secondary | ICD-10-CM | POA: Diagnosis not present

## 2023-06-05 DIAGNOSIS — I10 Essential (primary) hypertension: Secondary | ICD-10-CM | POA: Diagnosis not present

## 2023-06-05 DIAGNOSIS — C4441 Basal cell carcinoma of skin of scalp and neck: Secondary | ICD-10-CM | POA: Diagnosis not present

## 2023-06-05 DIAGNOSIS — Z299 Encounter for prophylactic measures, unspecified: Secondary | ICD-10-CM | POA: Diagnosis not present

## 2023-06-26 DIAGNOSIS — R5383 Other fatigue: Secondary | ICD-10-CM | POA: Diagnosis not present

## 2023-06-26 DIAGNOSIS — Z299 Encounter for prophylactic measures, unspecified: Secondary | ICD-10-CM | POA: Diagnosis not present

## 2023-06-26 DIAGNOSIS — E7800 Pure hypercholesterolemia, unspecified: Secondary | ICD-10-CM | POA: Diagnosis not present

## 2023-06-26 DIAGNOSIS — Z79899 Other long term (current) drug therapy: Secondary | ICD-10-CM | POA: Diagnosis not present

## 2023-06-26 DIAGNOSIS — I10 Essential (primary) hypertension: Secondary | ICD-10-CM | POA: Diagnosis not present

## 2023-06-26 DIAGNOSIS — Z0000 Encounter for general adult medical examination without abnormal findings: Secondary | ICD-10-CM | POA: Diagnosis not present

## 2023-06-27 DIAGNOSIS — R5383 Other fatigue: Secondary | ICD-10-CM | POA: Diagnosis not present

## 2023-06-27 DIAGNOSIS — E7800 Pure hypercholesterolemia, unspecified: Secondary | ICD-10-CM | POA: Diagnosis not present

## 2023-06-27 DIAGNOSIS — Z79899 Other long term (current) drug therapy: Secondary | ICD-10-CM | POA: Diagnosis not present

## 2023-06-30 DIAGNOSIS — E1165 Type 2 diabetes mellitus with hyperglycemia: Secondary | ICD-10-CM | POA: Diagnosis not present

## 2023-07-06 DIAGNOSIS — Z299 Encounter for prophylactic measures, unspecified: Secondary | ICD-10-CM | POA: Diagnosis not present

## 2023-07-06 DIAGNOSIS — C4491 Basal cell carcinoma of skin, unspecified: Secondary | ICD-10-CM | POA: Diagnosis not present

## 2023-07-06 DIAGNOSIS — I10 Essential (primary) hypertension: Secondary | ICD-10-CM | POA: Diagnosis not present

## 2023-07-06 DIAGNOSIS — C4441 Basal cell carcinoma of skin of scalp and neck: Secondary | ICD-10-CM | POA: Diagnosis not present

## 2023-07-06 DIAGNOSIS — R52 Pain, unspecified: Secondary | ICD-10-CM | POA: Diagnosis not present

## 2023-07-31 DIAGNOSIS — E1165 Type 2 diabetes mellitus with hyperglycemia: Secondary | ICD-10-CM | POA: Diagnosis not present

## 2023-08-30 DIAGNOSIS — E1165 Type 2 diabetes mellitus with hyperglycemia: Secondary | ICD-10-CM | POA: Diagnosis not present

## 2023-09-18 DIAGNOSIS — E1165 Type 2 diabetes mellitus with hyperglycemia: Secondary | ICD-10-CM | POA: Diagnosis not present

## 2023-09-18 DIAGNOSIS — E1129 Type 2 diabetes mellitus with other diabetic kidney complication: Secondary | ICD-10-CM | POA: Diagnosis not present

## 2023-09-18 DIAGNOSIS — R809 Proteinuria, unspecified: Secondary | ICD-10-CM | POA: Diagnosis not present

## 2023-09-18 DIAGNOSIS — E1159 Type 2 diabetes mellitus with other circulatory complications: Secondary | ICD-10-CM | POA: Diagnosis not present

## 2023-09-18 DIAGNOSIS — Z299 Encounter for prophylactic measures, unspecified: Secondary | ICD-10-CM | POA: Diagnosis not present

## 2023-09-18 DIAGNOSIS — I77810 Thoracic aortic ectasia: Secondary | ICD-10-CM | POA: Diagnosis not present

## 2023-09-18 DIAGNOSIS — I10 Essential (primary) hypertension: Secondary | ICD-10-CM | POA: Diagnosis not present

## 2023-09-18 DIAGNOSIS — I152 Hypertension secondary to endocrine disorders: Secondary | ICD-10-CM | POA: Diagnosis not present

## 2023-09-29 DIAGNOSIS — E1165 Type 2 diabetes mellitus with hyperglycemia: Secondary | ICD-10-CM | POA: Diagnosis not present

## 2023-10-09 DIAGNOSIS — I10 Essential (primary) hypertension: Secondary | ICD-10-CM | POA: Diagnosis not present

## 2023-10-09 DIAGNOSIS — Z299 Encounter for prophylactic measures, unspecified: Secondary | ICD-10-CM | POA: Diagnosis not present

## 2023-10-09 DIAGNOSIS — E1165 Type 2 diabetes mellitus with hyperglycemia: Secondary | ICD-10-CM | POA: Diagnosis not present

## 2023-10-09 DIAGNOSIS — M545 Low back pain, unspecified: Secondary | ICD-10-CM | POA: Diagnosis not present

## 2023-10-29 DIAGNOSIS — E1165 Type 2 diabetes mellitus with hyperglycemia: Secondary | ICD-10-CM | POA: Diagnosis not present

## 2023-11-29 DIAGNOSIS — E1165 Type 2 diabetes mellitus with hyperglycemia: Secondary | ICD-10-CM | POA: Diagnosis not present

## 2023-12-08 DIAGNOSIS — I10 Essential (primary) hypertension: Secondary | ICD-10-CM | POA: Diagnosis not present

## 2023-12-08 DIAGNOSIS — Z299 Encounter for prophylactic measures, unspecified: Secondary | ICD-10-CM | POA: Diagnosis not present

## 2023-12-08 DIAGNOSIS — M79604 Pain in right leg: Secondary | ICD-10-CM | POA: Diagnosis not present

## 2023-12-08 DIAGNOSIS — E1129 Type 2 diabetes mellitus with other diabetic kidney complication: Secondary | ICD-10-CM | POA: Diagnosis not present

## 2023-12-08 DIAGNOSIS — E1165 Type 2 diabetes mellitus with hyperglycemia: Secondary | ICD-10-CM | POA: Diagnosis not present

## 2023-12-08 DIAGNOSIS — R809 Proteinuria, unspecified: Secondary | ICD-10-CM | POA: Diagnosis not present

## 2023-12-26 DIAGNOSIS — I10 Essential (primary) hypertension: Secondary | ICD-10-CM | POA: Diagnosis not present

## 2023-12-26 DIAGNOSIS — E1169 Type 2 diabetes mellitus with other specified complication: Secondary | ICD-10-CM | POA: Diagnosis not present

## 2023-12-26 DIAGNOSIS — M546 Pain in thoracic spine: Secondary | ICD-10-CM | POA: Diagnosis not present

## 2023-12-26 DIAGNOSIS — Z299 Encounter for prophylactic measures, unspecified: Secondary | ICD-10-CM | POA: Diagnosis not present

## 2023-12-30 DIAGNOSIS — E1165 Type 2 diabetes mellitus with hyperglycemia: Secondary | ICD-10-CM | POA: Diagnosis not present

## 2024-01-01 DIAGNOSIS — Z5320 Procedure and treatment not carried out because of patient's decision for unspecified reasons: Secondary | ICD-10-CM | POA: Diagnosis not present

## 2024-01-01 DIAGNOSIS — Z1389 Encounter for screening for other disorder: Secondary | ICD-10-CM | POA: Diagnosis not present

## 2024-01-01 DIAGNOSIS — Z713 Dietary counseling and surveillance: Secondary | ICD-10-CM | POA: Diagnosis not present

## 2024-01-01 DIAGNOSIS — Z299 Encounter for prophylactic measures, unspecified: Secondary | ICD-10-CM | POA: Diagnosis not present

## 2024-01-01 DIAGNOSIS — I10 Essential (primary) hypertension: Secondary | ICD-10-CM | POA: Diagnosis not present

## 2024-01-01 DIAGNOSIS — Z7189 Other specified counseling: Secondary | ICD-10-CM | POA: Diagnosis not present

## 2024-01-01 DIAGNOSIS — Z0000 Encounter for general adult medical examination without abnormal findings: Secondary | ICD-10-CM | POA: Diagnosis not present

## 2024-01-01 DIAGNOSIS — R5383 Other fatigue: Secondary | ICD-10-CM | POA: Diagnosis not present

## 2024-01-19 DIAGNOSIS — Z1212 Encounter for screening for malignant neoplasm of rectum: Secondary | ICD-10-CM | POA: Diagnosis not present

## 2024-01-19 DIAGNOSIS — Z1211 Encounter for screening for malignant neoplasm of colon: Secondary | ICD-10-CM | POA: Diagnosis not present

## 2024-01-29 DIAGNOSIS — E1165 Type 2 diabetes mellitus with hyperglycemia: Secondary | ICD-10-CM | POA: Diagnosis not present

## 2024-02-29 DIAGNOSIS — E1165 Type 2 diabetes mellitus with hyperglycemia: Secondary | ICD-10-CM | POA: Diagnosis not present

## 2024-03-08 DIAGNOSIS — H40033 Anatomical narrow angle, bilateral: Secondary | ICD-10-CM | POA: Diagnosis not present

## 2024-03-08 DIAGNOSIS — H2513 Age-related nuclear cataract, bilateral: Secondary | ICD-10-CM | POA: Diagnosis not present

## 2024-03-30 DIAGNOSIS — E1165 Type 2 diabetes mellitus with hyperglycemia: Secondary | ICD-10-CM | POA: Diagnosis not present

## 2024-04-04 DIAGNOSIS — Z299 Encounter for prophylactic measures, unspecified: Secondary | ICD-10-CM | POA: Diagnosis not present

## 2024-04-04 DIAGNOSIS — I10 Essential (primary) hypertension: Secondary | ICD-10-CM | POA: Diagnosis not present

## 2024-04-04 DIAGNOSIS — E113212 Type 2 diabetes mellitus with mild nonproliferative diabetic retinopathy with macular edema, left eye: Secondary | ICD-10-CM | POA: Diagnosis not present

## 2024-04-04 DIAGNOSIS — E119 Type 2 diabetes mellitus without complications: Secondary | ICD-10-CM | POA: Diagnosis not present

## 2024-04-30 DIAGNOSIS — E1165 Type 2 diabetes mellitus with hyperglycemia: Secondary | ICD-10-CM | POA: Diagnosis not present
